# Patient Record
Sex: Male | Born: 1990 | Race: Black or African American | Hispanic: No | Marital: Single | Smoking: Former smoker
Health system: Southern US, Community
[De-identification: ages and names within clinical notes are randomized; demographics above are authoritative.]

## PROBLEM LIST (undated history)

## (undated) DIAGNOSIS — K219 Gastro-esophageal reflux disease without esophagitis: Secondary | ICD-10-CM

## (undated) DIAGNOSIS — G4700 Insomnia, unspecified: Secondary | ICD-10-CM

## (undated) DIAGNOSIS — S065XAA Traumatic subdural hemorrhage with loss of consciousness status unknown, initial encounter: Secondary | ICD-10-CM

## (undated) DIAGNOSIS — F209 Schizophrenia, unspecified: Secondary | ICD-10-CM

## (undated) HISTORY — DX: Gastro-esophageal reflux disease without esophagitis: K21.9

## (undated) HISTORY — DX: Schizophrenia, unspecified: F20.9

## (undated) HISTORY — PX: HAND SURGERY: SHX662

---
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
## 2009-01-29 ENCOUNTER — Emergency Department (HOSPITAL_COMMUNITY): Admission: EM | Admit: 2009-01-29 | Discharge: 2009-01-29 | Payer: Self-pay | Admitting: Emergency Medicine

## 2009-02-05 ENCOUNTER — Emergency Department (HOSPITAL_COMMUNITY): Admission: EM | Admit: 2009-02-05 | Discharge: 2009-02-05 | Payer: Self-pay | Admitting: Emergency Medicine

## 2010-04-24 ENCOUNTER — Emergency Department (HOSPITAL_COMMUNITY)
Admission: EM | Admit: 2010-04-24 | Discharge: 2010-04-24 | Payer: Self-pay | Source: Home / Self Care | Admitting: Emergency Medicine

## 2010-04-24 ENCOUNTER — Emergency Department (HOSPITAL_COMMUNITY)
Admission: EM | Admit: 2010-04-24 | Discharge: 2010-04-24 | Disposition: A | Discharge status: Other Acute Inpatient Hospital | Payer: Self-pay | Source: Home / Self Care | Admitting: Family Medicine

## 2010-07-04 LAB — RAPID STREP SCREEN (MED CTR MEBANE ONLY): Streptococcus, Group A Screen (Direct): NEGATIVE

## 2011-01-11 ENCOUNTER — Inpatient Hospital Stay (INDEPENDENT_AMBULATORY_CARE_PROVIDER_SITE_OTHER)
Admission: RE | Admit: 2011-01-11 | Discharge: 2011-01-11 | Disposition: A | Discharge status: Home / Self Care | Payer: Federal, State, Local not specified - PPO | Source: Ambulatory Visit | Attending: Emergency Medicine | Admitting: Emergency Medicine

## 2011-01-11 DIAGNOSIS — J029 Acute pharyngitis, unspecified: Secondary | ICD-10-CM

## 2011-02-10 ENCOUNTER — Ambulatory Visit (INDEPENDENT_AMBULATORY_CARE_PROVIDER_SITE_OTHER): Payer: Federal, State, Local not specified - PPO

## 2011-02-10 ENCOUNTER — Inpatient Hospital Stay (INDEPENDENT_AMBULATORY_CARE_PROVIDER_SITE_OTHER)
Admission: RE | Admit: 2011-02-10 | Discharge: 2011-02-10 | Disposition: A | Discharge status: Home / Self Care | Payer: Federal, State, Local not specified - PPO | Source: Ambulatory Visit | Attending: Emergency Medicine | Admitting: Emergency Medicine

## 2011-02-10 DIAGNOSIS — J019 Acute sinusitis, unspecified: Secondary | ICD-10-CM

## 2011-02-10 DIAGNOSIS — S62309A Unspecified fracture of unspecified metacarpal bone, initial encounter for closed fracture: Secondary | ICD-10-CM

## 2011-02-10 DIAGNOSIS — I10 Essential (primary) hypertension: Secondary | ICD-10-CM

## 2011-12-03 ENCOUNTER — Encounter (HOSPITAL_COMMUNITY): Payer: Self-pay

## 2011-12-03 ENCOUNTER — Emergency Department (INDEPENDENT_AMBULATORY_CARE_PROVIDER_SITE_OTHER)
Admission: EM | Admit: 2011-12-03 | Discharge: 2011-12-03 | Disposition: A | Discharge status: Home / Self Care | Payer: Self-pay | Source: Home / Self Care

## 2011-12-03 DIAGNOSIS — Z2089 Contact with and (suspected) exposure to other communicable diseases: Secondary | ICD-10-CM

## 2011-12-03 DIAGNOSIS — R300 Dysuria: Secondary | ICD-10-CM

## 2011-12-03 DIAGNOSIS — Z202 Contact with and (suspected) exposure to infections with a predominantly sexual mode of transmission: Secondary | ICD-10-CM

## 2011-12-03 LAB — POCT URINALYSIS DIP (DEVICE)
Glucose, UA: NEGATIVE mg/dL
Ketones, ur: NEGATIVE mg/dL
Leukocytes, UA: NEGATIVE

## 2011-12-03 MED ORDER — AZITHROMYCIN 250 MG PO TABS
1000.0000 mg | ORAL_TABLET | Freq: Every day | ORAL | Status: DC
  Administered 2011-12-03: 1000 mg via ORAL

## 2011-12-03 MED ORDER — METRONIDAZOLE 500 MG PO TABS: 2000.0000 mg | ORAL_TABLET | Freq: Once | ORAL | Status: AC

## 2011-12-03 MED ORDER — AZITHROMYCIN 250 MG PO TABS: 1000.0000 mg | ORAL_TABLET | Freq: Once | ORAL | Status: DC

## 2011-12-03 MED ORDER — LIDOCAINE HCL (PF) 1 % IJ SOLN
INTRAMUSCULAR | Status: AC
  Filled 2011-12-03: qty 5

## 2011-12-03 MED ORDER — CEFTRIAXONE SODIUM 250 MG IJ SOLR
INTRAMUSCULAR | Status: AC
  Filled 2011-12-03: qty 250

## 2011-12-03 MED ORDER — AZITHROMYCIN 250 MG PO TABS
ORAL_TABLET | ORAL | Status: AC
  Filled 2011-12-03: qty 4

## 2011-12-03 MED ORDER — METRONIDAZOLE 500 MG PO TABS: 2000.0000 mg | ORAL_TABLET | Freq: Once | ORAL | Status: DC

## 2011-12-03 MED ORDER — CEFTRIAXONE SODIUM 250 MG IJ SOLR
250.0000 mg | Freq: Once | INTRAMUSCULAR | Status: AC
  Administered 2011-12-03: 250 mg via INTRAMUSCULAR

## 2011-12-03 NOTE — ED Notes (Signed)
Pt has burning when he urinates for two weeks and states his girlfriend told him she has unknown std.

## 2011-12-03 NOTE — ED Provider Notes (Signed)
History     CSN: [id number]  Arrival date & time 12/03/11  1736   None     Chief Complaint  Patient presents with  . Exposure to STD    (Consider location/radiation/quality/duration/timing/severity/associated sxs/prior treatment) Patient is a 21 y.o. male presenting with STD exposure. The history is provided by the patient.  Exposure to STD This is a new problem. The current episode started more than 1 week ago. The problem occurs daily. The problem has not changed since onset. patient reports his girlfriend told him today that she was tested positive for STD, admits to burning with urination for the past two weeks, denies penile discharge.  No known history of STD.  Does not use condoms, denies change in sexual partner.    History reviewed. No pertinent past medical history.  History reviewed. No pertinent past surgical history.  History reviewed. No pertinent family history.  History  Substance Use Topics  . Smoking status: Never Smoker   . Smokeless tobacco: Not on file  . Alcohol Use: Yes      Review of Systems  Constitutional: Negative.   Respiratory: Negative.   Cardiovascular: Negative.   Gastrointestinal: Negative.   Genitourinary: Positive for dysuria. Negative for urgency, frequency, hematuria, flank pain, decreased urine volume, discharge, penile swelling, scrotal swelling, difficulty urinating, genital sores, penile pain and testicular pain.    Allergies  Review of patient's allergies indicates no known allergies.  Home Medications  No current outpatient prescriptions on file.  BP 146/54  Pulse 60  Temp 98.5 F (36.9 C) (Oral)  Resp 18  SpO2 100%  Physical Exam  Nursing note and vitals reviewed. Constitutional: He is oriented to person, place, and time. Vital signs are normal. He appears well-developed and well-nourished. He is active and cooperative.  HENT:  Head: Normocephalic.  Eyes: Conjunctivae are normal. Pupils are equal, round, and  reactive to light. No scleral icterus.  Neck: Trachea normal. Neck supple.  Cardiovascular: Normal rate, regular rhythm, normal heart sounds and normal pulses.   Pulmonary/Chest: Effort normal and breath sounds normal.  Abdominal: Soft. Normal appearance and bowel sounds are normal. There is no tenderness. There is no rebound and no CVA tenderness.  Genitourinary: Testes normal and penis normal. Cremasteric reflex is present. Right testis shows no swelling and no tenderness. Right testis is descended. Left testis shows no swelling and no tenderness. Left testis is descended. Circumcised. No phimosis, hypospadias, penile erythema or penile tenderness. No discharge found.  Lymphadenopathy:    He has no cervical adenopathy.    He has no axillary adenopathy.       Right: No inguinal adenopathy present.       Left: No inguinal adenopathy present.  Neurological: He is alert and oriented to person, place, and time. No cranial nerve deficit or sensory deficit. GCS eye subscore is 4. GCS verbal subscore is 5. GCS motor subscore is 6.  Skin: Skin is warm and dry. No rash noted.  Psychiatric: He has a normal mood and affect. His speech is normal and behavior is normal. Judgment and thought content normal. Cognition and memory are normal.    ED Course  Procedures (including critical care time)  Labs Reviewed  POCT URINALYSIS DIP (DEVICE) - Abnormal; Notable for the following:    Bilirubin Urine SMALL (*)     Protein, ur 30 (*)     All other components within normal limits  GC/CHLAMYDIA PROBE AMP, GENITAL   No results found.   1. Exposure to  STD   2. Dysuria       MDM  Rocephin 250mg  and Azithromycin 1gm administered in office.  Take flagyl as prescribed.  Await GC/CT culture.  Abstain from intercourse for 7 days.  Condoms for STD prevention.  Recheck for STD's in 3 months.        Johnsie Kindred, NP 12/03/11 1944  Johnsie Kindred, NP 12/08/11 2231

## 2011-12-04 LAB — GC/CHLAMYDIA PROBE AMP, GENITAL
Chlamydia, DNA Probe: NEGATIVE
GC Probe Amp, Genital: NEGATIVE

## 2011-12-20 NOTE — ED Provider Notes (Signed)
Medical screening examination/treatment/procedure(s) were performed by non-physician practitioner and as supervising physician I was immediately available for consultation/collaboration.  Luiz Blare MD   Luiz Blare, MD 12/20/11 2109

## 2015-01-04 ENCOUNTER — Emergency Department (HOSPITAL_COMMUNITY)
Admission: EM | Admit: 2015-01-04 | Discharge: 2015-01-04 | Disposition: A | Discharge status: Home / Self Care | Payer: Federal, State, Local not specified - PPO | Attending: Physician Assistant | Admitting: Physician Assistant

## 2015-01-04 ENCOUNTER — Encounter (HOSPITAL_COMMUNITY): Payer: Self-pay | Admitting: Emergency Medicine

## 2015-01-04 DIAGNOSIS — J039 Acute tonsillitis, unspecified: Secondary | ICD-10-CM | POA: Diagnosis not present

## 2015-01-04 DIAGNOSIS — J029 Acute pharyngitis, unspecified: Secondary | ICD-10-CM | POA: Diagnosis not present

## 2015-01-04 DIAGNOSIS — R509 Fever, unspecified: Secondary | ICD-10-CM

## 2015-01-04 DIAGNOSIS — Z720 Tobacco use: Secondary | ICD-10-CM | POA: Diagnosis not present

## 2015-01-04 DIAGNOSIS — R52 Pain, unspecified: Secondary | ICD-10-CM

## 2015-01-04 DIAGNOSIS — R0981 Nasal congestion: Secondary | ICD-10-CM

## 2015-01-04 LAB — RAPID STREP SCREEN (MED CTR MEBANE ONLY): Streptococcus, Group A Screen (Direct): NEGATIVE

## 2015-01-04 MED ORDER — PHENOL 1.4 % MT LIQD
1.0000 | OROMUCOSAL | Status: DC | PRN
  Administered 2015-01-04: 1 via OROMUCOSAL
  Filled 2015-01-04 (×2): qty 177

## 2015-01-04 MED ORDER — ACETAMINOPHEN 325 MG PO TABS
650.0000 mg | ORAL_TABLET | Freq: Once | ORAL | Status: AC
  Administered 2015-01-04: 650 mg via ORAL
  Filled 2015-01-04: qty 2

## 2015-01-04 NOTE — ED Notes (Signed)
Called pharmacy to check on chloraseptic

## 2015-01-04 NOTE — Discharge Instructions (Signed)
Continue to stay well-hydrated. Gargle warm salt water and spit it out. Use chloraseptic spray as needed for sore throat. Continue to alternate between Tylenol and Ibuprofen for pain or fever. Use Mucinex for cough suppression/expectoration of mucus. Use netipot and flonase to help with nasal congestion. May consider over-the-counter Benadryl or other antihistamine to decrease secretions and for watery itchy eyes. Followup with your primary care doctor in 5-7 days for recheck of ongoing symptoms. Return to emergency department for emergent changing or worsening of symptoms.   Pharyngitis Pharyngitis is a sore throat (pharynx). There is redness, pain, and swelling of your throat. HOME CARE   Drink enough fluids to keep your pee (urine) clear or pale yellow.  Only take medicine as told by your doctor.  You may get sick again if you do not take medicine as told. Finish your medicines, even if you start to feel better.  Do not take aspirin.  Rest.  Rinse your mouth (gargle) with salt water ( tsp of salt per 1 qt of water) every 1-2 hours. This will help the pain.  If you are not at risk for choking, you can suck on hard candy or sore throat lozenges. GET HELP IF:  You have large, tender lumps on your neck.  You have a rash.  You cough up green, yellow-brown, or bloody spit. GET HELP RIGHT AWAY IF:   You have a stiff neck.  You drool or cannot swallow liquids.  You throw up (vomit) or are not able to keep medicine or liquids down.  You have very bad pain that does not go away with medicine.  You have problems breathing (not from a stuffy nose). MAKE SURE YOU:   Understand these instructions.  Will watch your condition.  Will get help right away if you are not doing well or get worse. Document Released: 09/27/2007 Document Revised: 01/29/2013 Document Reviewed: 12/16/2012 [facility] Patient Information 2015 [place], [place]. This information is not intended to replace advice  given to you by your health care provider. Make sure you discuss any questions you have with your health care provider.  Salt Water Gargle This solution will help make your mouth and throat feel better. HOME CARE INSTRUCTIONS   Mix 1 teaspoon of salt in 8 ounces of warm water.  Gargle with this solution as much or often as you need or as directed. Swish and gargle gently if you have any sores or wounds in your mouth.  Do not swallow this mixture. Document Released: 01/13/2004 Document Revised: 07/03/2011 Document Reviewed: 06/05/2008 [facility] Patient Information 2015 [place], [place]. This information is not intended to replace advice given to you by your health care provider. Make sure you discuss any questions you have with your health care provider.  Upper Respiratory Infection, Adult An upper respiratory infection (URI) is also sometimes known as the common cold. The upper respiratory tract includes the nose, sinuses, throat, trachea, and bronchi. Bronchi are the airways leading to the lungs. Most people improve within 1 week, but symptoms can last up to 2 weeks. A residual cough may last even longer.  CAUSES Many different viruses can infect the tissues lining the upper respiratory tract. The tissues become irritated and inflamed and often become very moist. Mucus production is also common. A cold is contagious. You can easily spread the virus to others by oral contact. This includes kissing, sharing a glass, coughing, or sneezing. Touching your mouth or nose and then touching a surface, which is then touched by another  person, can also spread the virus. SYMPTOMS  Symptoms typically develop 1 to 3 days after you come in contact with a cold virus. Symptoms vary from person to person. They may include:  Runny nose.  Sneezing.  Nasal congestion.  Sinus irritation.  Sore throat.  Loss of voice (laryngitis).  Cough.  Fatigue.  Muscle aches.  Loss of  appetite.  Headache.  Low-grade fever. DIAGNOSIS  You might diagnose your own cold based on familiar symptoms, since most people get a cold 2 to 3 times a year. Your caregiver can confirm this based on your exam. Most importantly, your caregiver can check that your symptoms are not due to another disease such as strep throat, sinusitis, pneumonia, asthma, or epiglottitis. Blood tests, throat tests, and X-rays are not necessary to diagnose a common cold, but they may sometimes be helpful in excluding other more serious diseases. Your caregiver will decide if any further tests are required. RISKS AND COMPLICATIONS  You may be at risk for a more severe case of the common cold if you smoke cigarettes, have chronic heart disease (such as heart failure) or lung disease (such as asthma), or if you have a weakened immune system. The very young and very old are also at risk for more serious infections. Bacterial sinusitis, middle ear infections, and bacterial pneumonia can complicate the common cold. The common cold can worsen asthma and chronic obstructive pulmonary disease (COPD). Sometimes, these complications can require emergency medical care and may be life-threatening. PREVENTION  The best way to protect against getting a cold is to practice good hygiene. Avoid oral or hand contact with people with cold symptoms. Wash your hands often if contact occurs. There is no clear evidence that vitamin C, vitamin E, echinacea, or exercise reduces the chance of developing a cold. However, it is always recommended to get plenty of rest and practice good nutrition. TREATMENT  Treatment is directed at relieving symptoms. There is no cure. Antibiotics are not effective, because the infection is caused by a virus, not by bacteria. Treatment may include:  Increased fluid intake. Sports drinks offer valuable electrolytes, sugars, and fluids.  Breathing heated mist or steam (vaporizer or shower).  Eating chicken soup  or other clear broths, and maintaining good nutrition.  Getting plenty of rest.  Using gargles or lozenges for comfort.  Controlling fevers with ibuprofen or acetaminophen as directed by your caregiver.  Increasing usage of your inhaler if you have asthma. Zinc gel and zinc lozenges, taken in the first 24 hours of the common cold, can shorten the duration and lessen the severity of symptoms. Pain medicines may help with fever, muscle aches, and throat pain. A variety of non-prescription medicines are available to treat congestion and runny nose. Your caregiver can make recommendations and may suggest nasal or lung inhalers for other symptoms.  HOME CARE INSTRUCTIONS   Only take over-the-counter or prescription medicines for pain, discomfort, or fever as directed by your caregiver.  Use a warm mist humidifier or inhale steam from a shower to increase air moisture. This may keep secretions moist and make it easier to breathe.  Drink enough water and fluids to keep your urine clear or pale yellow.  Rest as needed.  Return to work when your temperature has returned to normal or as your caregiver advises. You may need to stay home longer to avoid infecting others. You can also use a face mask and careful hand washing to prevent spread of the virus. SEEK MEDICAL CARE  IF:   After the first few days, you feel you are getting worse rather than better.  You need your caregiver's advice about medicines to control symptoms.  You develop chills, worsening shortness of breath, or brown or red sputum. These may be signs of pneumonia.  You develop yellow or brown nasal discharge or pain in the face, especially when you bend forward. These may be signs of sinusitis.  You develop a fever, swollen neck glands, pain with swallowing, or white areas in the back of your throat. These may be signs of strep throat. SEEK IMMEDIATE MEDICAL CARE IF:   You have a fever.  You develop severe or persistent  headache, ear pain, sinus pain, or chest pain.  You develop wheezing, a prolonged cough, cough up blood, or have a change in your usual mucus (if you have chronic lung disease).  You develop sore muscles or a stiff neck. Document Released: 10/04/2000 Document Revised: 07/03/2011 Document Reviewed: 07/16/2013 [facility] Patient Information 2015 [place], [place]. This information is not intended to replace advice given to you by your health care provider. Make sure you discuss any questions you have with your health care provider.

## 2015-01-04 NOTE — ED Notes (Signed)
Patient states generalized body aches x 2 days.  Patient also complain of sore throat.  Patient states did take theraflu and bc at home without relief.   Patient denies N/V/D.

## 2015-01-04 NOTE — ED Provider Notes (Signed)
CSN: [id number]     Arrival date & time 01/04/15  4098 History  This chart was scribed for non-physician practitioner Allen Derry, PA-C working with Abelino Derrick, MD by Lyndel Safe, ED Scribe. This patient was seen in room TR06C/TR06C and the patient's care was started at 11:05 AM.   Chief Complaint  Patient presents with  . flu like symptoms    Patient is a 24 y.o. male presenting with URI. The history is provided by the patient. No language interpreter was used.  URI Presenting symptoms: cough (dry), fever, rhinorrhea and sore throat   Presenting symptoms: no ear pain   Severity:  Moderate Onset quality:  Sudden Duration:  1 day Timing:  Constant Progression:  Worsening Chronicity:  New Relieved by:  OTC medications (Theraflu, BC powder) Worsened by:  Drinking and eating Ineffective treatments:  None tried Associated symptoms: headaches (mild) and myalgias   Associated symptoms: no neck pain and no wheezing   Risk factors: no immunosuppression, no recent travel and no sick contacts    HPI Comments: James [name] is a 24 y.o. male, with no chronic medical conditions, who presents to the Emergency Department complaining of sudden onset, constant, 8/10, scratching, non-radiating sore throat onset 2 days ago that is exacerbated with eating or drinking, relieved with Theraflu and BC powders. Associated symptoms include mild headache, generalized body aches, mild intermittent dry cough, clear rhinorrhea, low grade fever of 100.9 F, and chills. Pt denies otalgia, ear drainage, trismus, drooling, wheezing, nausea, vomiting, diarrhea, abdominal pain, CP, SOB, arthralgias, hematuria, dysuria, numbness, tingling or weakness, neck stiffness, penile discharge. No recent travel or sick contacts.   History reviewed. No pertinent past medical history. History reviewed. No pertinent past surgical history. No family history on file. Social History  Substance Use Topics  .  Smoking status: Current Every Day Smoker -- 0.50 packs/day    Types: Cigarettes  . Smokeless tobacco: None  . Alcohol Use: Yes     Comment: every day    Review of Systems  Constitutional: Positive for fever and chills.  HENT: Positive for rhinorrhea and sore throat. Negative for drooling, ear discharge, ear pain and trouble swallowing.   Eyes: Negative for discharge, itching and visual disturbance.  Respiratory: Positive for cough (dry). Negative for shortness of breath and wheezing.   Cardiovascular: Negative for chest pain.  Gastrointestinal: Negative for nausea, vomiting, abdominal pain and diarrhea.  Genitourinary: Negative for dysuria, hematuria and discharge.  Musculoskeletal: Positive for myalgias. Negative for neck pain and neck stiffness.  Skin: Negative for rash.  Allergic/Immunologic: Negative for immunocompromised state.  Neurological: Positive for headaches (mild). Negative for weakness and numbness.  A complete 10 system review of systems was obtained and is otherwise negative except at noted in the HPI and PMH.  Allergies  Review of patient's allergies indicates no known allergies.  Home Medications   Prior to Admission medications   Not on File   BP 130/71 mmHg  Pulse 90  Temp(Src) 100.9 F (38.3 C) (Oral)  Resp 18  Ht 5\' 10"  (1.778 m)  Wt 170 lb (77.111 kg)  BMI 24.39 kg/m2  SpO2 96% Physical Exam  Constitutional: He is oriented to person, place, and time. He appears well-developed and well-nourished.  Non-toxic appearance. No distress.  Febrile at 100.50F, nontoxic, NAD  HENT:  Head: Normocephalic and atraumatic.  Right Ear: Hearing, tympanic membrane, external ear and ear canal normal.  Left Ear: Hearing, tympanic membrane, external ear and ear canal normal.  Nose: Mucosal edema and rhinorrhea present.  Mouth/Throat: Mucous membranes are normal. No trismus in the jaw. No uvula swelling. Oropharyngeal exudate, posterior oropharyngeal edema and posterior  oropharyngeal erythema present. No tonsillar abscesses.  Ears are clear bilaterally. Nose with mild mucosal edema and clear rhinorrhea. Oropharynx without uvular swelling or deviation, no trismus or drooling, with 2+ B/L tonsillar swelling and erythema, +exudates, no PTA.    Eyes: Conjunctivae and EOM are normal. Right eye exhibits no discharge. Left eye exhibits no discharge.  Neck: Normal range of motion. Neck supple.  No meningismus.   Cardiovascular: Normal rate, regular rhythm, normal heart sounds and intact distal pulses.  Exam reveals no gallop and no friction rub.   No murmur heard. Pulmonary/Chest: Effort normal and breath sounds normal. No respiratory distress. He has no decreased breath sounds. He has no wheezes. He has no rhonchi. He has no rales.  Abdominal: Normal appearance. He exhibits no distension. There is no tenderness. There is no rigidity, no rebound and no guarding.  Musculoskeletal: Normal range of motion.  Lymphadenopathy:       Head (right side): Tonsillar adenopathy present.       Head (left side): Tonsillar adenopathy present.  Bilateral tonsillar LAD with mild TTP.   Neurological: He is alert and oriented to person, place, and time. He has normal strength. No sensory deficit.  Skin: Skin is warm, dry and intact. No rash noted.  Psychiatric: He has a normal mood and affect.  Nursing note and vitals reviewed.   ED Course  Procedures  DIAGNOSTIC STUDIES: Oxygen Saturation is 96% on RA, adequate by my interpretation.    COORDINATION OF CARE: 11:08 AM Discussed treatment plan with pt. Pt acknowledges and agrees to plan.   Labs Review Labs Reviewed  RAPID STREP SCREEN (NOT AT [facility])  CULTURE, GROUP A STREP    Imaging Review No results found. I have personally reviewed and evaluated these images and lab results as part of my medical decision-making.   EKG Interpretation None      MDM   Final diagnoses:  Pharyngitis  Tonsillitis  Body aches  Nasal  congestion  Fever, unspecified fever cause    24 y.o. male here with URI symptoms, body aches, sore throat, fever. Clear lung exam, tonsils with exudates and swelling, will obtain RST. Will give tylenol and chloraseptic spray and reassess shortly.   12:34 PM RST neg. Discussed symptomatic control. Recheck VS reveals improvement of fever. Discussed tylenol/motrin for pain/fever. Salt water gargles. Too early to test for mono but discussed that this could be a possible alternative diagnosis. Close PCP f/up. I explained the diagnosis and have given explicit precautions to return to the ER including for any other new or worsening symptoms. The patient understands and accepts the medical plan as it's been dictated and I have answered their questions. Discharge instructions concerning home care and prescriptions have been given. The patient is STABLE and is discharged to home in good condition.   I personally performed the services described in this documentation, which was scribed in my presence. The recorded information has been reviewed and is accurate.  BP 120/74 mmHg  Pulse 95  Temp(Src) 99.4 F (37.4 C) (Oral)  Resp 17  Ht  (1.778 m)  Wt 170 lb (77.111 kg)  BMI 24.39 kg/m2  SpO2 98%  Meds ordered this encounter  Medications  . acetaminophen (TYLENOL) tablet 650 mg    Sig:   . phenol (CHLORASEPTIC) mouth spray 1 spray  SigAllen Derry, PA-C 01/04/15 1241  Courteney Randall An, MD 01/04/15 1536

## 2015-01-06 LAB — CULTURE, GROUP A STREP

## 2016-10-29 ENCOUNTER — Emergency Department (HOSPITAL_COMMUNITY): Payer: Federal, State, Local not specified - PPO

## 2016-10-29 ENCOUNTER — Encounter (HOSPITAL_COMMUNITY): Payer: Self-pay

## 2016-10-29 ENCOUNTER — Emergency Department (HOSPITAL_COMMUNITY)
Admission: EM | Admit: 2016-10-29 | Discharge: 2016-10-29 | Disposition: A | Discharge status: Home / Self Care | Payer: Federal, State, Local not specified - PPO | Attending: Physician Assistant | Admitting: Physician Assistant

## 2016-10-29 DIAGNOSIS — F17210 Nicotine dependence, cigarettes, uncomplicated: Secondary | ICD-10-CM | POA: Insufficient documentation

## 2016-10-29 DIAGNOSIS — R509 Fever, unspecified: Secondary | ICD-10-CM

## 2016-10-29 DIAGNOSIS — A770 Spotted fever due to Rickettsia rickettsii: Secondary | ICD-10-CM | POA: Insufficient documentation

## 2016-10-29 LAB — RAPID HIV SCREEN (HIV 1/2 AB+AG)
HIV 1/2 Antibodies: NONREACTIVE
HIV-1 P24 ANTIGEN - HIV24: NONREACTIVE

## 2016-10-29 MED ORDER — IBUPROFEN 800 MG PO TABS
800.0000 mg | ORAL_TABLET | Freq: Once | ORAL | Status: AC
  Administered 2016-10-29: 800 mg via ORAL
  Filled 2016-10-29: qty 1

## 2016-10-29 MED ORDER — DOXYCYCLINE HYCLATE 100 MG PO CAPS: 100.0000 mg | ORAL_CAPSULE | Freq: Two times a day (BID) | ORAL | 0 refills | Status: AC

## 2016-10-29 MED ORDER — DOXYCYCLINE HYCLATE 100 MG PO TABS
100.0000 mg | ORAL_TABLET | Freq: Once | ORAL | Status: AC
  Administered 2016-10-29: 100 mg via ORAL
  Filled 2016-10-29: qty 1

## 2016-10-29 NOTE — Discharge Instructions (Signed)
We are unsure what is causing her fever. We are giving U and antibiotics in case that it is due to a tick born illness. Please follow-up with a primary care physician if not improving. Please use ibuprofen or Tylenol to help control your fevers at home.  To find a primary care or specialty doctor please call [phone] or [phone] to access "[facility] Find a Doctor Service."  You may also go on the [facility] website at [email or web address]  There are also multiple [facility] practices throughout the [place] that are frequently accepting new patients. You may find a clinic that is close to your home and contact them.  [name] -  [address] [place] [phone]  [facility] in [place] (also locations in [place] and [place]) -  [address] [place] [address] [phone]  [facility] -  [address] [place] [address] [phone]

## 2016-10-29 NOTE — ED Provider Notes (Signed)
[facility] Provider Note   CSN: [id number] Arrival date & time: 10/29/16  1513  By signing my name below, I, Thelma Barge, attest that this documentation has been prepared under the direction and in the presence of Mackuen, Courteney Lyn, *. Electronically Signed: Thelma Barge, Scribe. 10/29/16. 4:01 PM.  History   Chief Complaint Chief Complaint  Patient presents with  . cough/congestion   The history is provided by the patient. No language interpreter was used.    HPI Comments: James [name] is a 26 y.o. male who presents to the Emergency Department complaining of constant, gradually worsening headache for 2 days. He has associated generalized body aches, diaphoresis, and subjective fever. He reports abdominal pain but this has improved. He has been taking tylenol, advil, and ibuprofen with mild to no relief. His girlfriend notes he was sweating profusely last night and tried to place a fan on him with a cold compress with no relief. Last week he reports his stuff in his storage unit was moldy when he moved to a new house. He denies nausea, vomiting, sore throat, cough, and rashes. He further denies suspected tick bites and any outside yard work.   History reviewed. No pertinent past medical history.  There are no active problems to display for this patient.   History reviewed. No pertinent surgical history.     Home Medications    Prior to Admission medications   Not on File    Family History No family history on file.  Social History Social History  Substance Use Topics  . Smoking status: Current Every Day Smoker    Packs/day: 0.50    Types: Cigarettes  . Smokeless tobacco: Never Used  . Alcohol use Yes     Comment: every day     Allergies   Patient has no known allergies.   Review of Systems Review of Systems  Constitutional: Positive for diaphoresis and fever.  HENT: Negative for sore throat.   Gastrointestinal: Negative for nausea and  vomiting.  Musculoskeletal: Positive for myalgias.  Neurological: Positive for headaches.  All other systems reviewed and are negative.    Physical Exam Updated Vital Signs BP (!) 154/82   Pulse 80   Temp 99.2 F (37.3 C) (Oral)   Resp 18   SpO2 99%   Physical Exam  Constitutional: He is oriented to person, place, and time. He appears well-developed and well-nourished.  HENT:  Head: Normocephalic.  No erythema in throat  Eyes: EOM are normal.  Neck: Normal range of motion.  Pulmonary/Chest: Effort normal.  Abdominal: He exhibits no distension.  Musculoskeletal: Normal range of motion.  Neurological: He is alert and oriented to person, place, and time.  Skin: No rash noted.  Psychiatric: He has a normal mood and affect.  Nursing note and vitals reviewed.    ED Treatments / Results  DIAGNOSTIC STUDIES: Oxygen Saturation is 99% on RA, normal by my interpretation.    COORDINATION OF CARE: 3:52 PM Discussed treatment plan with pt at bedside and pt agreed to plan.  Labs (all labs ordered are listed, but only abnormal results are displayed) Labs Reviewed - No data to display  EKG  EKG Interpretation None       Radiology No results found.  Procedures Procedures (including critical care time)  Medications Ordered in ED Medications - No data to display   Initial Impression / Assessment and Plan / ED Course  I have reviewed the triage vital signs and the nursing notes.  Pertinent labs & imaging results that were available during my care of the patient were reviewed by me and considered in my medical decision making (see chart for details).     I personally performed the services described in this documentation, which was scribed in my presence. The recorded information has been reviewed and is accurate.   Patient is a 26 year old male presenting with fever. He reports she's been having on and off fevers for last 2-3 days. Patient has no other symptoms.  Patient reports mild cough. It is a summertime therefore atypical time to have a virus. The patient does not report that he does not spent a lot of time outdoors but I will cover him with doxycycline for tick borne illness. We'll send off RPR, HIV, RMSF.  We'll treat mild fever with ibuprofen. Patient otherwise eating and drinking normally. We'll have him follow-up with a primary care physician. Encouraged him to return with any focal symptoms such as rash, vomiting, urinary symptoms.  Final Clinical Impressions(s) / ED Diagnoses   Final diagnoses:  None    New Prescriptions New Prescriptions   No medications on file     Abelino DerrickMackuen, Courteney Lyn, MD 10/29/16 1715

## 2016-10-29 NOTE — ED Triage Notes (Signed)
Patient complains of 2 days of cough and congestion, reports fever and chills with same. Smoker. States that the symptoms started when he moved his furniture out of storage

## 2016-10-30 LAB — RPR: RPR: NONREACTIVE

## 2016-10-31 LAB — ROCKY MTN SPOTTED FVR ABS PNL(IGG+IGM)
RMSF IGG: POSITIVE — AB
RMSF IgM: 0.74 index (ref 0.00–0.89)

## 2016-10-31 LAB — RMSF, IGG, IFA

## 2017-01-24 ENCOUNTER — Encounter (HOSPITAL_COMMUNITY): Payer: Self-pay

## 2017-01-24 ENCOUNTER — Ambulatory Visit (HOSPITAL_COMMUNITY)
Admission: EM | Admit: 2017-01-24 | Discharge: 2017-01-24 | Disposition: A | Discharge status: Home / Self Care | Payer: BLUE CROSS/BLUE SHIELD | Attending: Family Medicine | Admitting: Family Medicine

## 2017-01-24 ENCOUNTER — Encounter (HOSPITAL_COMMUNITY): Payer: Self-pay | Admitting: Emergency Medicine

## 2017-01-24 ENCOUNTER — Emergency Department (HOSPITAL_COMMUNITY)
Admission: EM | Admit: 2017-01-24 | Discharge: 2017-01-25 | Disposition: A | Discharge status: Home / Self Care | Payer: BLUE CROSS/BLUE SHIELD | Attending: Emergency Medicine | Admitting: Emergency Medicine

## 2017-01-24 DIAGNOSIS — R440 Auditory hallucinations: Secondary | ICD-10-CM | POA: Diagnosis not present

## 2017-01-24 DIAGNOSIS — F1210 Cannabis abuse, uncomplicated: Secondary | ICD-10-CM | POA: Insufficient documentation

## 2017-01-24 DIAGNOSIS — F19951 Other psychoactive substance use, unspecified with psychoactive substance-induced psychotic disorder with hallucinations: Secondary | ICD-10-CM | POA: Diagnosis not present

## 2017-01-24 DIAGNOSIS — Z87891 Personal history of nicotine dependence: Secondary | ICD-10-CM | POA: Diagnosis not present

## 2017-01-24 DIAGNOSIS — R443 Hallucinations, unspecified: Secondary | ICD-10-CM | POA: Diagnosis not present

## 2017-01-24 DIAGNOSIS — F22 Delusional disorders: Secondary | ICD-10-CM | POA: Insufficient documentation

## 2017-01-24 DIAGNOSIS — R441 Visual hallucinations: Secondary | ICD-10-CM

## 2017-01-24 DIAGNOSIS — F1290 Cannabis use, unspecified, uncomplicated: Secondary | ICD-10-CM | POA: Diagnosis not present

## 2017-01-24 DIAGNOSIS — F19929 Other psychoactive substance use, unspecified with intoxication, unspecified: Secondary | ICD-10-CM | POA: Diagnosis present

## 2017-01-24 DIAGNOSIS — F1910 Other psychoactive substance abuse, uncomplicated: Secondary | ICD-10-CM

## 2017-01-24 LAB — COMPREHENSIVE METABOLIC PANEL
ALK PHOS: 49 U/L (ref 38–126)
ALT: 16 U/L — AB (ref 17–63)
AST: 23 U/L (ref 15–41)
Albumin: 4.8 g/dL (ref 3.5–5.0)
Anion gap: 11 (ref 5–15)
BUN: 16 mg/dL (ref 6–20)
CALCIUM: 10.2 mg/dL (ref 8.9–10.3)
CHLORIDE: 106 mmol/L (ref 101–111)
CO2: 24 mmol/L (ref 22–32)
Creatinine, Ser: 1.07 mg/dL (ref 0.61–1.24)
Glucose, Bld: 105 mg/dL — ABNORMAL HIGH (ref 65–99)
Potassium: 3.5 mmol/L (ref 3.5–5.1)
Sodium: 141 mmol/L (ref 135–145)
Total Bilirubin: 1.2 mg/dL (ref 0.3–1.2)
Total Protein: 8.1 g/dL (ref 6.5–8.1)

## 2017-01-24 LAB — CBC WITH DIFFERENTIAL/PLATELET
Basophils Absolute: 0 10*3/uL (ref 0.0–0.1)
Basophils Relative: 0 %
Eosinophils Absolute: 0 10*3/uL (ref 0.0–0.7)
Eosinophils Relative: 0 %
HEMATOCRIT: 44.5 % (ref 39.0–52.0)
HEMOGLOBIN: 15.4 g/dL (ref 13.0–17.0)
LYMPHS ABS: 0.9 10*3/uL (ref 0.7–4.0)
LYMPHS PCT: 14 %
MCH: 29.7 pg (ref 26.0–34.0)
MCHC: 34.6 g/dL (ref 30.0–36.0)
MCV: 85.9 fL (ref 78.0–100.0)
MONOS PCT: 7 %
Monocytes Absolute: 0.5 10*3/uL (ref 0.1–1.0)
NEUTROS ABS: 5.4 10*3/uL (ref 1.7–7.7)
NEUTROS PCT: 79 %
Platelets: 281 10*3/uL (ref 150–400)
RBC: 5.18 MIL/uL (ref 4.22–5.81)
RDW: 12.8 % (ref 11.5–15.5)
WBC: 6.8 10*3/uL (ref 4.0–10.5)

## 2017-01-24 LAB — RAPID URINE DRUG SCREEN, HOSP PERFORMED
Amphetamines: NOT DETECTED
Barbiturates: NOT DETECTED
Benzodiazepines: NOT DETECTED
Cocaine: NOT DETECTED
OPIATES: NOT DETECTED
Tetrahydrocannabinol: POSITIVE — AB

## 2017-01-24 LAB — SALICYLATE LEVEL

## 2017-01-24 LAB — ETHANOL

## 2017-01-24 LAB — ACETAMINOPHEN LEVEL

## 2017-01-24 NOTE — Discharge Instructions (Signed)
Go to [facility] now to see doctors that can treat your specific symptoms and help you with your problems.
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
## 2017-01-24 NOTE — ED Provider Notes (Signed)
[facility]    CSN: [id number] Arrival date & time: 01/24/17  1409     History   Chief Complaint Chief Complaint  Patient presents with  . Hallucinations    HPI James [name] is a 26 y.o. male.   26 year old male presents to the urgent care stating that he is feeling a little anxious feel like his head is full sometimes with a headache and feels like he cannot sit still. He is not sleeping well and admits to visual and all Tory hallucinations. He also describes some paranoid activity in which she thinks somebody is trying to get him. He had been hearing voices around him, not necessarily telling him to do anything. This morning he thought he saw someone walking across his yard and any looked quickly again and he was not there. Also admits to illicit drug use with weed and liquor. At this time patient denies desire to harm self or others. When asked if he had a way to do so he said no and no plan.he is driving, he drove here and he states he can drop to Center long.      History reviewed. No pertinent past medical history.  There are no active problems to display for this patient.   History reviewed. No pertinent surgical history.     Home Medications    Prior to Admission medications   Not on File    Family History No family history on file.  Social History Social History  Substance Use Topics  . Smoking status: Former Smoker    Packs/day: 0.50    Types: Cigarettes  . Smokeless tobacco: Never Used  . Alcohol use Yes     Comment: every day     Allergies   Patient has no known allergies.   Review of Systems Review of Systems  Constitutional: Negative.   Psychiatric/Behavioral: Positive for hallucinations and sleep disturbance. The patient is nervous/anxious.   All other systems reviewed and are negative.    Physical Exam Triage Vital Signs ED Triage Vitals  Enc Vitals Group     BP 01/24/17 1425 126/87     Pulse Rate 01/24/17 1425 89      Resp 01/24/17 1425 16     Temp 01/24/17 1425 98.7 F (37.1 C)     Temp Source 01/24/17 1425 Oral     SpO2 01/24/17 1425 98 %     Weight 01/24/17 1427 160 lb (72.6 kg)     Height 01/24/17 1427  (1.753 m)     Head Circumference --      Peak Flow --      Pain Score 01/24/17 1427 0     Pain Loc --      Pain Edu? --      Excl. in GC? --    No data found.   Updated Vital Signs BP 126/87   Pulse 89   Temp 98.7 F (37.1 C) (Oral)   Resp 16   Ht  (1.753 m)   Wt 160 lb (72.6 kg)   SpO2 98%   BMI 23.63 kg/m   Visual Acuity Right Eye Distance:   Left Eye Distance:   Bilateral Distance:    Right Eye Near:   Left Eye Near:    Bilateral Near:     Physical Exam  Constitutional: He is oriented to person, place, and time. He appears well-developed and well-nourished. No distress.  Neck: Neck supple.  Pulmonary/Chest: Effort normal.  Neurological: He  is alert and oriented to person, place, and time.  Skin: Skin is warm and dry.  Nursing note and vitals reviewed.    UC Treatments / Results  Labs (all labs ordered are listed, but only abnormal results are displayed) Labs Reviewed - No data to display  EKG  EKG Interpretation None       Radiology No results found.  Procedures Procedures (including critical care time)  Medications Ordered in UC Medications - No data to display   Initial Impression / Assessment and Plan / UC Course  I have reviewed the triage vital signs and the nursing notes.  Pertinent labs & imaging results that were available during my care of the patient were reviewed by me and considered in my medical decision making (see chart for details).    Go to [facility] now to see doctors that can treat your specific symptoms and help you with your problems.    Final Clinical Impressions(s) / UC Diagnoses   Final diagnoses:  Auditory hallucinations  Visual hallucinations  Drug abuse (HCC)  Paranoia (HCC)    New  Prescriptions New Prescriptions   No medications on file     Controlled Substance Prescriptions [place] Controlled Substance Registry consulted? Not Applicable   Hayden Rasmussen, NP 01/24/17 1541

## 2017-01-24 NOTE — BH Assessment (Signed)
[facility] Assessment Progress Note  James Sievert, PA recommends inpt treatment. EDP Cardama, Amadeo Garnet, MD notified of recommendation. Pt's nurse Marylou Flesher, RN also advised of disposition.   James [name], MSW, LCSW Therapeutic Triage Specialist  [phone]

## 2017-01-24 NOTE — ED Triage Notes (Signed)
Pt and family report that pt has been hearing and seeing things for the last week. Girlfriend reports that he has not been acting like himself and has been very paranoid. Pt denies pain, but states that he hs been itchy. Seen at urgent care earlier for same.

## 2017-01-24 NOTE — ED Triage Notes (Signed)
PT reports he is seeing and hearing things for the last week. PT reports he has hallucinated people walking by and hears general voices as though people are speaking. PT reports the voices do not give any specific commands, it's as if the TV is on.
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
## 2017-01-24 NOTE — ED Notes (Signed)
Bed: [facility]81 Expected date:  Expected time:  Means of arrival:  Comments: Hold for room 26

## 2017-01-25 DIAGNOSIS — Z87891 Personal history of nicotine dependence: Secondary | ICD-10-CM | POA: Diagnosis not present

## 2017-01-25 DIAGNOSIS — R443 Hallucinations, unspecified: Secondary | ICD-10-CM | POA: Diagnosis not present

## 2017-01-25 DIAGNOSIS — F19951 Other psychoactive substance use, unspecified with psychoactive substance-induced psychotic disorder with hallucinations: Secondary | ICD-10-CM | POA: Diagnosis not present

## 2017-01-25 DIAGNOSIS — F19929 Other psychoactive substance use, unspecified with intoxication, unspecified: Secondary | ICD-10-CM | POA: Diagnosis not present

## 2017-01-25 NOTE — [facility] Suicide Risk Assessment (Signed)
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 He appears well-developed and well-nourished.  HENT:  Head: Normocephalic.  Neck: Normal range of motion.  Respiratory: Effort normal.  Musculoskeletal: Normal range of motion.  Neurological: He is alert and oriented to person, place, and time.  Psychiatric: He has a normal mood and affect. His speech is normal and behavior is normal. Judgment and thought content normal. Cognition and memory are normal.    Review of Systems  Psychiatric/Behavioral: Positive for substance abuse.  All other systems reviewed and are negative.   Blood pressure 138/78, pulse 65, temperature 98.8 F (37.1 C), temperature source Oral, resp. rate 18, SpO2 100 %.There is no height or weight on file to calculate BMI.  General Appearance: Casual  Eye Contact:  Good  Speech:  Normal Rate  Volume:  Normal  Mood:  Euthymic  Affect:  Congruent  Thought Process:  Coherent and Descriptions of Associations: Intact  Orientation:  Full (Time, Place, and Person)  Thought Content:  WDL and Logical  Suicidal Thoughts:  No  Homicidal Thoughts:  No  Memory:  Immediate;   Good Recent;   Good Remote;   Good  Judgement:  Fair  Insight:  Fair  Psychomotor Activity:  Normal  Concentration:  Concentration: Good and Attention Span: Good  Recall:  Good   Fund of Knowledge:  Fair  Language:  Good  Akathisia:  No  Handed:  Right  AIMS (if indicated):     Assets:  Leisure Time Physical Health Resilience Social Support  ADL's:  Intact  Cognition:  WNL  Sleep:       Mental Status Per Nursing Assessment::   On Admission:   drug abuse with hallucinations  Demographic Factors:  Male and Adolescent or young adult  Loss Factors: Legal issues  Historical Factors: NA  Risk Reduction Factors:   Sense of responsibility to family, Living with another person, especially a relative and Positive social support  Continued Clinical Symptoms:  None  Cognitive Features That Contribute To Risk:  None    Suicide Risk:  Minimal: No identifiable suicidal ideation.  Patients presenting with no risk factors but with morbid ruminations; may be classified as minimal risk based on the severity of the depressive symptoms    Plan Of Care/Follow-up recommendations:  Activity:  as tolerated Diet:  heart healthy diet  Mckenna Boruff, NP 01/25/2017, 10:07 AM

## 2017-01-25 NOTE — BH Assessment (Signed)
[facility] Assessment Progress Note  Per Mojeed Akintayo, MD, this pt does not require psychiatric hospitalization at this time.  Pt is to be discharged from [facility] with referral information for area substance abuse treatment providers.  This has been included in pt's discharge instructions.  Pt would also benefit from seeing a Peer Support Specialist; they will be asked to speak with pt.  Pt's nurse, Ashley, has been notified.  James Donalson, MA Triage Specialist [phone]     

## 2017-01-25 NOTE — ED Notes (Addendum)
Patient denies S/HI/AH at this time. Patient reports VH. Plan of care discussed, Encouragement and support provided and safety maintain. Q 15 min safety checks in place and video monitoring.

## 2017-01-25 NOTE — ED Notes (Signed)
Peer support at bedside 

## 2017-01-25 NOTE — Consult Note (Signed)
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 male patient does not warrant admission.  HPI:  26 yo male who presented to the ED with hallucinations after using cannabis that he believes was laced.  Today, he is clear and coherent with no suicidal/homicidal ideations, hallucinations, or withdrawal symptoms.  No past psychiatric history besides substance abuse.  No past suicide attempts.  Caveat:  DWI court date in November.  Peer support referral, stable for discharge.  Past Psychiatric History: none   Risk to Self: None Risk to Others: None Prior Inpatient Therapy: Prior Inpatient Therapy: No Prior Outpatient Therapy: Prior Outpatient Therapy: No Does patient have an ACCT team?: No Does patient have Intensive In-House Services?  : No Does patient have [facility] services? : No Does patient have P4CC services?: No  Past Medical History: History reviewed. No pertinent past medical history. History reviewed. No pertinent surgical history. Family History: History reviewed. No pertinent family history. Family Psychiatric  History: unknown Social History:  History  Alcohol Use  . Yes    Comment: every day     History  Drug Use  . Types: Marijuana    Social History   Social History  . Marital status: Single    Spouse name: N/A  . Number of children: N/A  . Years of education: N/A   Social History Main Topics  . Smoking status: Former Smoker   Packs/day: 0.50    Types: Cigarettes  . Smokeless tobacco: Never Used  . Alcohol use Yes     Comment: every day  . Drug use: Yes    Types: Marijuana  . Sexual activity: Not Asked   Other Topics Concern  . None   Social History Narrative  . None   Additional Social History:    Allergies:  No Known Allergies  Labs:  Results for orders placed or performed during the hospital encounter of 01/24/17 (from the past 48 hour(s))  Comprehensive metabolic panel     Status: Abnormal   Collection Time: 01/24/17  4:43 PM  Result Value Ref Range   Sodium 141 135 - 145 mmol/L   Potassium 3.5 3.5 - 5.1 mmol/L   Chloride 106 101 - 111 mmol/L   CO2 24 22 - 32 mmol/L   Glucose, Bld 105 (H) 65 - 99 mg/dL   BUN 16 6 - 20 mg/dL   Creatinine, Ser 1.07 0.61 - 1.24 mg/dL   Calcium 10.2 8.9 - 10.3 mg/dL   Total Protein 8.1 6.5 - 8.1 g/dL   Albumin 4.8 3.5 - 5.0 g/dL   AST 23 15 - 41 U/L   ALT 16 (L) 17 - 63 U/L   Alkaline Phosphatase 49 38 - 126 U/L   Total Bilirubin 1.2 0.3 - 1.2 mg/dL   GFR calc non Af Amer >60 >60 mL/min   GFR calc Af Amer >60 >60 mL/min    Comment: (NOTE) The eGFR has been calculated using the CKD EPI equation. This calculation has not been validated in all clinical situations. eGFR's  persistently <60 mL/min signify possible Chronic Kidney Disease.    Anion gap 11 5 - 15  Ethanol     Status: None   Collection Time: 01/24/17  4:43 PM  Result Value Ref Range   Alcohol, Ethyl (B) <10 <10 mg/dL    Comment:        LOWEST DETECTABLE LIMIT FOR SERUM ALCOHOL IS 10 mg/dL FOR MEDICAL PURPOSES ONLY Please note change in reference range.   CBC with Diff     Status: None   Collection Time: 01/24/17  4:43 PM  Result Value Ref Range   WBC 6.8 4.0 - 10.5 K/uL   RBC 5.18 4.22 - 5.81 MIL/uL   Hemoglobin 15.4 13.0 - 17.0 g/dL   HCT 44.5 39.0 - 52.0 %   MCV 85.9 78.0 - 100.0 fL   MCH 29.7 26.0 - 34.0 pg   MCHC 34.6 30.0 - 36.0 g/dL   RDW 12.8 11.5 - 15.5 %   Platelets 281  150 - 400 K/uL   Neutrophils Relative % 79 %   Neutro Abs 5.4 1.7 - 7.7 K/uL   Lymphocytes Relative 14 %   Lymphs Abs 0.9 0.7 - 4.0 K/uL   Monocytes Relative 7 %   Monocytes Absolute 0.5 0.1 - 1.0 K/uL   Eosinophils Relative 0 %   Eosinophils Absolute 0.0 0.0 - 0.7 K/uL   Basophils Relative 0 %   Basophils Absolute 0.0 0.0 - 0.1 K/uL  Salicylate level     Status: None   Collection Time: 01/24/17  4:43 PM  Result Value Ref Range   Salicylate Lvl <9.5 2.8 - 30.0 mg/dL  Acetaminophen level     Status: Abnormal   Collection Time: 01/24/17  4:43 PM  Result Value Ref Range   Acetaminophen (Tylenol), Serum <10 (L) 10 - 30 ug/mL    Comment:        THERAPEUTIC CONCENTRATIONS VARY SIGNIFICANTLY. A RANGE OF 10-30 ug/mL MAY BE AN EFFECTIVE CONCENTRATION FOR MANY PATIENTS. HOWEVER, SOME ARE BEST TREATED AT CONCENTRATIONS OUTSIDE THIS RANGE. ACETAMINOPHEN CONCENTRATIONS >150 ug/mL AT 4 HOURS AFTER INGESTION AND >50 ug/mL AT 12 HOURS AFTER INGESTION ARE OFTEN ASSOCIATED WITH TOXIC REACTIONS.   Urine rapid drug screen (hosp performed)     Status: Abnormal   Collection Time: 01/24/17  5:07 PM  Result Value Ref Range   Opiates NONE DETECTED NONE DETECTED   Cocaine NONE DETECTED NONE DETECTED   Benzodiazepines NONE DETECTED NONE DETECTED   Amphetamines NONE DETECTED NONE DETECTED   Tetrahydrocannabinol POSITIVE (A) NONE DETECTED   Barbiturates NONE DETECTED NONE DETECTED    Comment:        DRUG SCREEN FOR MEDICAL PURPOSES ONLY.  IF CONFIRMATION IS NEEDED FOR ANY PURPOSE, NOTIFY LAB WITHIN 5 DAYS.        LOWEST DETECTABLE LIMITS FOR URINE DRUG SCREEN Drug Class       Cutoff (ng/mL) Amphetamine      1000 Barbiturate      200 Benzodiazepine   638 Tricyclics       756 Opiates          300 Cocaine          300 THC              50     No current facility-administered medications for this encounter.    Current Outpatient Prescriptions  Medication Sig Dispense Refill  .  ibuprofen (ADVIL,MOTRIN) 200 MG tablet Take 400-800 mg by mouth every 6 (six) hours as  needed for moderate pain.      Musculoskeletal: Strength & Muscle Tone: within normal limits Gait & Station: normal Patient leans: N/A  Psychiatric Specialty Exam: Physical Exam  Constitutional: He is oriented to person, place, and time. He appears well-developed and well-nourished.  HENT:  Head: Normocephalic.  Neck: Normal range of motion.  Respiratory: Effort normal.  Musculoskeletal: Normal range of motion.  Neurological: He is alert and oriented to person, place, and time.  Psychiatric: He has a normal mood and affect. His speech is normal and behavior is normal. Judgment and thought content normal. Cognition and memory are normal.    Review of Systems  Psychiatric/Behavioral: Positive for substance abuse.  All other systems reviewed and are negative.   Blood pressure 138/78, pulse 65, temperature 98.8 F (37.1 C), temperature source Oral, resp. rate 18, SpO2 100 %.There is no height or weight on file to calculate BMI.  General Appearance: Casual  Eye Contact:  Good  Speech:  Normal Rate  Volume:  Normal  Mood:  Euthymic  Affect:  Congruent  Thought Process:  Coherent and Descriptions of Associations: Intact  Orientation:  Full (Time, Place, and Person)  Thought Content:  WDL and Logical  Suicidal Thoughts:  No  Homicidal Thoughts:  No  Memory:  Immediate;   Good Recent;   Good Remote;   Good  Judgement:  Fair  Insight:  Fair  Psychomotor Activity:  Normal  Concentration:  Concentration: Good and Attention Span: Good  Recall:  Good  Fund of Knowledge:  Fair  Language:  Good  Akathisia:  No  Handed:  Right  AIMS (if indicated):     Assets:  Leisure Time Physical Health Resilience Social Support  ADL's:  Intact  Cognition:  WNL  Sleep:        Treatment Plan Summary: Daily contact with patient to assess and evaluate symptoms and progress in treatment, Medication  management and Plan substance induced psychosis with hallucinations:  -Crisis stabilization -Medication management:  None started as he needed to clear the drugs -Individual and substance abuse counseling -Peer support referral  Disposition: No evidence of imminent risk to self or others at present.    Waylan Boga, NP 01/25/2017 10:00 AM  Patient seen face-to-face for psychiatric evaluation, chart reviewed and case discussed with the physician extender and developed treatment plan. Reviewed the information documented and agree with the treatment plan. Corena Pilgrim, MD

## 2017-01-25 NOTE — Discharge Instructions (Signed)
To help you maintain a sober lifestyle, a substance abuse treatment program may be beneficial to you.  Contact one of the following providers at your earliest opportunity to ask about enrolling in their program: ° °     [facility] °     [address] °     [place], [place] [address] °     [phone] °     New patients are seen at the walk-in clinic every Tuesday from 9:00 am - 12:00 pm ° °     [facility] °     [address] °     [place], [place] [address] °     [phone] °

## 2017-01-25 NOTE — ED Notes (Signed)
Pt discharged home per MD order. Verbalizes understanding of AVS and follow up instructions. Denies HI/SI and A/V hallucinations at this time. All belongings returned to patient and belonging sheet signed. Signed E-signature. Patient ambulatory off unit with MHT to lobby.

## 2017-01-25 NOTE — Patient Outreach (Signed)
ED Peer Support Specialist Patient Intake (Complete at intake & 30-60 Day Follow-up)  Name: James [name]  MRN: [id number]  Age: 26 y.o.   Date of Admission: 01/25/2017  Intake: Initial Comments:      Primary Reason Admitted:Pt reports he began to experience these symptoms after using marijuana with a friend about 3 weeks ago. Pt reports he has been experiencing AVH without command. Pt states he thought he saw someone walking in his yard and when he looked again there was no one there. Pt reports he is paranoid and constantly thinks people are out to get him or talking about him. Pt states today he intentionally ingested 2 Advil because his head was hurting and about 2 hours later he took 6 ibuprofen. Pt was asked if he was trying to harm himself by taking that many pills and pt stated "I don't know. I just wanted the voices to stop." Pt stated "um, I just feel weird."    Lab values: Alcohol/ETOH: Positive Positive UDS? No Amphetamines: No Barbiturates: No Benzodiazepines: No Cocaine: No Opiates: No Cannabinoids: Yes  Demographic information: Gender: Male Ethnicity: African American Marital Status: Single Insurance Status: Other (comment) (Blue cross blue shield) Control and instrumentation engineer (Work Engineer, agricultural, Sales executive, etc.: Yes (Food stamps) Lives with: Partner/Spouse Living situation: House/Apartment  Reported Patient History: Patient reported health conditions: Depression Patient aware of HIV and hepatitis status: No  In past year, has patient visited ED for any reason? Yes  Number of ED visits: 1  Reason(s) for visit: Fever hot and cold   In past year, has patient been hospitalized for any reason? No  Number of hospitalizations:    Reason(s) for hospitalization:    In past year, has patient been arrested? No  Number of arrests:    Reason(s) for arrest:    In past year, has patient been incarcerated? No  Number of incarcerations:    Reason(s)  for incarceration:    In past year, has patient received medication-assisted treatment? No  In past year, patient received the following treatments: Other (comment) ([facility])  In past year, has patient received any harm reduction services? No  Did this include any of the following?    In past year, has patient received care from a mental health provider for diagnosis other than SUD? No  In past year, is this first time patient has overdosed? No (Advil)  Number of past overdoses: 0  In past year, is this first time patient has been hospitalized for an overdose? No  Number of hospitalizations for overdose(s):    Is patient currently receiving treatment for a mental health diagnosis? No  Patient reports experiencing difficulty participating in SUD treatment: No    Most important reason(s) for this difficulty?    Has patient received prior services for treatment? Yes ([facility])  In past, patient has received services from following agencies: [facility]  Plan of Care:  Suggested follow up at these agencies/treatment centers: [facility]  Other information: Processed with Pt and encouraged him to continue to attend the meetings also to contact [facility] Jonny Ruiz or Arlys John when discharged to continue to offer services.    Arlys John Deniyah Dillavou, [facility]  01/25/2017 11:23 AM

## 2017-03-07 ENCOUNTER — Telehealth (HOSPITAL_COMMUNITY): Payer: Self-pay

## 2017-03-07 NOTE — Telephone Encounter (Signed)
[facility] left message asking for Pt to contact the return number for [facility].

## 2017-09-16 ENCOUNTER — Emergency Department (HOSPITAL_COMMUNITY): Payer: BLUE CROSS/BLUE SHIELD

## 2017-09-16 ENCOUNTER — Inpatient Hospital Stay (HOSPITAL_COMMUNITY)
Admission: EM | Admit: 2017-09-16 | Discharge: 2017-09-18 | DRG: 083 | Disposition: A | Discharge status: Home / Self Care | Payer: BLUE CROSS/BLUE SHIELD | Attending: Neurosurgery | Admitting: Neurosurgery

## 2017-09-16 ENCOUNTER — Other Ambulatory Visit: Payer: Self-pay

## 2017-09-16 ENCOUNTER — Encounter (HOSPITAL_COMMUNITY): Payer: Self-pay | Admitting: *Deleted

## 2017-09-16 DIAGNOSIS — R402240 Coma scale, best verbal response, confused conversation, unspecified time: Secondary | ICD-10-CM | POA: Diagnosis present

## 2017-09-16 DIAGNOSIS — S069XAA Unspecified intracranial injury with loss of consciousness status unknown, initial encounter: Secondary | ICD-10-CM | POA: Diagnosis present

## 2017-09-16 DIAGNOSIS — W1789XA Other fall from one level to another, initial encounter: Secondary | ICD-10-CM | POA: Diagnosis present

## 2017-09-16 DIAGNOSIS — Z87891 Personal history of nicotine dependence: Secondary | ICD-10-CM | POA: Diagnosis not present

## 2017-09-16 DIAGNOSIS — S065X9A Traumatic subdural hemorrhage with loss of consciousness of unspecified duration, initial encounter: Principal | ICD-10-CM | POA: Diagnosis present

## 2017-09-16 DIAGNOSIS — S02119A Unspecified fracture of occiput, initial encounter for closed fracture: Secondary | ICD-10-CM | POA: Diagnosis present

## 2017-09-16 DIAGNOSIS — R402360 Coma scale, best motor response, obeys commands, unspecified time: Secondary | ICD-10-CM | POA: Diagnosis present

## 2017-09-16 DIAGNOSIS — F10121 Alcohol abuse with intoxication delirium: Secondary | ICD-10-CM | POA: Diagnosis present

## 2017-09-16 DIAGNOSIS — R402140 Coma scale, eyes open, spontaneous, unspecified time: Secondary | ICD-10-CM | POA: Diagnosis present

## 2017-09-16 DIAGNOSIS — F10921 Alcohol use, unspecified with intoxication delirium: Secondary | ICD-10-CM

## 2017-09-16 DIAGNOSIS — S0219XA Other fracture of base of skull, initial encounter for closed fracture: Secondary | ICD-10-CM | POA: Diagnosis present

## 2017-09-16 DIAGNOSIS — S069X9A Unspecified intracranial injury with loss of consciousness of unspecified duration, initial encounter: Secondary | ICD-10-CM | POA: Diagnosis present

## 2017-09-16 DIAGNOSIS — S065XAA Traumatic subdural hemorrhage with loss of consciousness status unknown, initial encounter: Secondary | ICD-10-CM

## 2017-09-16 DIAGNOSIS — S02118A Other fracture of occiput, initial encounter for closed fracture: Secondary | ICD-10-CM

## 2017-09-16 LAB — RAPID URINE DRUG SCREEN, HOSP PERFORMED
Amphetamines: NOT DETECTED
Barbiturates: NOT DETECTED
Benzodiazepines: POSITIVE — AB
Cocaine: NOT DETECTED
Opiates: NOT DETECTED
Tetrahydrocannabinol: POSITIVE — AB

## 2017-09-16 LAB — CBC WITH DIFFERENTIAL/PLATELET
Abs Immature Granulocytes: 0 10*3/uL (ref 0.0–0.1)
BASOS ABS: 0 10*3/uL (ref 0.0–0.1)
BASOS PCT: 1 %
EOS ABS: 0.1 10*3/uL (ref 0.0–0.7)
Eosinophils Relative: 1 %
HCT: 44.4 % (ref 39.0–52.0)
Hemoglobin: 14.8 g/dL (ref 13.0–17.0)
Immature Granulocytes: 0 %
Lymphocytes Relative: 46 %
Lymphs Abs: 4.2 10*3/uL — ABNORMAL HIGH (ref 0.7–4.0)
MCH: 27.8 pg (ref 26.0–34.0)
MCHC: 33.3 g/dL (ref 30.0–36.0)
MCV: 83.5 fL (ref 78.0–100.0)
MONOS PCT: 8 %
Monocytes Absolute: 0.7 10*3/uL (ref 0.1–1.0)
Neutro Abs: 3.8 10*3/uL (ref 1.7–7.7)
Neutrophils Relative %: 44 %
PLATELETS: 311 10*3/uL (ref 150–400)
RBC: 5.32 MIL/uL (ref 4.22–5.81)
RDW: 12.7 % (ref 11.5–15.5)
WBC: 8.8 10*3/uL (ref 4.0–10.5)

## 2017-09-16 LAB — ACETAMINOPHEN LEVEL

## 2017-09-16 LAB — COMPREHENSIVE METABOLIC PANEL
ALK PHOS: 51 U/L (ref 38–126)
ALT: 20 U/L (ref 17–63)
AST: 31 U/L (ref 15–41)
Albumin: 4.1 g/dL (ref 3.5–5.0)
Anion gap: 12 (ref 5–15)
BUN: 12 mg/dL (ref 6–20)
CALCIUM: 8.8 mg/dL — AB (ref 8.9–10.3)
CO2: 22 mmol/L (ref 22–32)
CREATININE: 1.42 mg/dL — AB (ref 0.61–1.24)
Chloride: 108 mmol/L (ref 101–111)
Glucose, Bld: 114 mg/dL — ABNORMAL HIGH (ref 65–99)
Potassium: 2.8 mmol/L — ABNORMAL LOW (ref 3.5–5.1)
Sodium: 142 mmol/L (ref 135–145)
Total Bilirubin: 0.7 mg/dL (ref 0.3–1.2)
Total Protein: 7.4 g/dL (ref 6.5–8.1)

## 2017-09-16 LAB — CK: Total CK: 359 U/L (ref 49–397)

## 2017-09-16 LAB — ETHANOL: Alcohol, Ethyl (B): 293 mg/dL — ABNORMAL HIGH (ref <10)

## 2017-09-16 LAB — MRSA PCR SCREENING: MRSA by PCR: NEGATIVE

## 2017-09-16 LAB — SALICYLATE LEVEL

## 2017-09-16 MED ORDER — HALOPERIDOL LACTATE 5 MG/ML IJ SOLN
INTRAMUSCULAR | Status: AC
  Administered 2017-09-16: 5 mg via INTRAMUSCULAR
  Filled 2017-09-16: qty 1

## 2017-09-16 MED ORDER — SODIUM CHLORIDE 0.9 % IV SOLN
INTRAVENOUS | Status: DC
  Administered 2017-09-16: 07:00:00 via INTRAVENOUS
  Administered 2017-09-17: 1000 mL via INTRAVENOUS

## 2017-09-16 MED ORDER — CEFAZOLIN SODIUM-DEXTROSE 1-4 GM/50ML-% IV SOLN: 1.0000 g | Freq: Three times a day (TID) | INTRAVENOUS | Status: DC

## 2017-09-16 MED ORDER — VITAMIN B-1 100 MG PO TABS
100.0000 mg | ORAL_TABLET | Freq: Every day | ORAL | Status: DC
  Administered 2017-09-16 – 2017-09-17 (×2): 100 mg via ORAL
  Filled 2017-09-16 (×2): qty 1

## 2017-09-16 MED ORDER — SODIUM CHLORIDE 0.9 % IV BOLUS
1000.0000 mL | Freq: Once | INTRAVENOUS | Status: AC
  Administered 2017-09-16: 1000 mL via INTRAVENOUS

## 2017-09-16 MED ORDER — IOHEXOL 300 MG/ML  SOLN
100.0000 mL | Freq: Once | INTRAMUSCULAR | Status: AC | PRN
  Administered 2017-09-16: 100 mL via INTRAVENOUS

## 2017-09-16 MED ORDER — DEXMEDETOMIDINE HCL IN NACL 200 MCG/50ML IV SOLN: 0.2000 ug/kg/h | INTRAVENOUS | Status: DC

## 2017-09-16 MED ORDER — HYDROMORPHONE HCL 2 MG/ML IJ SOLN: 0.5000 mg | INTRAMUSCULAR | Status: DC | PRN

## 2017-09-16 MED ORDER — TETANUS-DIPHTH-ACELL PERTUSSIS 5-2.5-18.5 LF-MCG/0.5 IM SUSP
0.5000 mL | Freq: Once | INTRAMUSCULAR | Status: AC
  Administered 2017-09-16: 0.5 mL via INTRAMUSCULAR

## 2017-09-16 MED ORDER — ONDANSETRON HCL 4 MG/2ML IJ SOLN
4.0000 mg | Freq: Four times a day (QID) | INTRAMUSCULAR | Status: DC | PRN
  Administered 2017-09-16: 4 mg via INTRAVENOUS
  Filled 2017-09-16: qty 2

## 2017-09-16 MED ORDER — POTASSIUM CHLORIDE 10 MEQ/100ML IV SOLN
10.0000 meq | Freq: Once | INTRAVENOUS | Status: AC
  Administered 2017-09-16: 10 meq via INTRAVENOUS
  Filled 2017-09-16: qty 100

## 2017-09-16 MED ORDER — LORAZEPAM 2 MG/ML IJ SOLN
2.0000 mg | Freq: Once | INTRAMUSCULAR | Status: AC
  Administered 2017-09-16: 2 mg via INTRAVENOUS

## 2017-09-16 MED ORDER — ACETAMINOPHEN 325 MG PO TABS
650.0000 mg | ORAL_TABLET | Freq: Four times a day (QID) | ORAL | Status: DC | PRN
  Administered 2017-09-16 – 2017-09-18 (×4): 650 mg via ORAL
  Filled 2017-09-16 (×4): qty 2

## 2017-09-16 MED ORDER — WHITE PETROLATUM EX OINT
TOPICAL_OINTMENT | CUTANEOUS | Status: AC
  Administered 2017-09-16: 0.2 via TOPICAL
  Filled 2017-09-16: qty 28.35

## 2017-09-16 MED ORDER — TETANUS-DIPHTH-ACELL PERTUSSIS 5-2.5-18.5 LF-MCG/0.5 IM SUSP
INTRAMUSCULAR | Status: AC
  Filled 2017-09-16: qty 0.5

## 2017-09-16 MED ORDER — PANTOPRAZOLE SODIUM 40 MG PO TBEC
40.0000 mg | DELAYED_RELEASE_TABLET | Freq: Two times a day (BID) | ORAL | Status: DC
  Administered 2017-09-17 (×2): 40 mg via ORAL
  Filled 2017-09-16 (×3): qty 1

## 2017-09-16 MED ORDER — FAMOTIDINE IN NACL 20-0.9 MG/50ML-% IV SOLN
20.0000 mg | Freq: Two times a day (BID) | INTRAVENOUS | Status: DC
  Administered 2017-09-16 (×2): 20 mg via INTRAVENOUS
  Filled 2017-09-16: qty 50

## 2017-09-16 MED ORDER — LORAZEPAM 2 MG/ML IJ SOLN
INTRAMUSCULAR | Status: AC
  Filled 2017-09-16: qty 1

## 2017-09-16 MED ORDER — ADULT MULTIVITAMIN W/MINERALS CH
1.0000 | ORAL_TABLET | Freq: Every day | ORAL | Status: DC
  Administered 2017-09-16 – 2017-09-17 (×2): 1 via ORAL
  Filled 2017-09-16 (×2): qty 1

## 2017-09-16 MED ORDER — ONDANSETRON 4 MG PO TBDP
4.0000 mg | ORAL_TABLET | Freq: Four times a day (QID) | ORAL | Status: DC | PRN
  Administered 2017-09-17: 4 mg via ORAL
  Filled 2017-09-16 (×2): qty 1

## 2017-09-16 MED ORDER — IOHEXOL 300 MG/ML  SOLN: 100.0000 mL | Freq: Once | INTRAMUSCULAR | Status: DC | PRN

## 2017-09-16 MED ORDER — WHITE PETROLATUM EX OINT
TOPICAL_OINTMENT | CUTANEOUS | Status: DC | PRN
  Administered 2017-09-16: 0.2 via TOPICAL

## 2017-09-16 MED ORDER — HYDROMORPHONE HCL 1 MG/ML IJ SOLN
0.5000 mg | INTRAMUSCULAR | Status: DC | PRN
  Administered 2017-09-16 (×2): 0.5 mg via INTRAVENOUS
  Filled 2017-09-16 (×2): qty 1

## 2017-09-16 MED ORDER — HALOPERIDOL LACTATE 5 MG/ML IJ SOLN
5.0000 mg | Freq: Once | INTRAMUSCULAR | Status: AC
  Administered 2017-09-16: 5 mg via INTRAMUSCULAR

## 2017-09-16 MED ORDER — FOLIC ACID 1 MG PO TABS
1.0000 mg | ORAL_TABLET | Freq: Every day | ORAL | Status: DC
  Administered 2017-09-16 – 2017-09-17 (×2): 1 mg via ORAL
  Filled 2017-09-16 (×2): qty 1
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
## 2017-09-16 NOTE — ED Notes (Signed)
Paged neurosurgery/POOL

## 2017-09-16 NOTE — ED Notes (Signed)
[employer] Case Number [id number]

## 2017-09-16 NOTE — ED Notes (Signed)
Condom Cath Placed on Pt.
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
## 2017-09-16 NOTE — ED Notes (Signed)
James [name] [phone] James [name] [phone]
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
## 2017-09-17 ENCOUNTER — Inpatient Hospital Stay (HOSPITAL_COMMUNITY): Payer: BLUE CROSS/BLUE SHIELD

## 2017-09-17 LAB — CBC
HEMATOCRIT: 41.2 % (ref 39.0–52.0)
Hemoglobin: 13.8 g/dL (ref 13.0–17.0)
MCH: 28 pg (ref 26.0–34.0)
MCHC: 33.5 g/dL (ref 30.0–36.0)
MCV: 83.6 fL (ref 78.0–100.0)
Platelets: 240 10*3/uL (ref 150–400)
RBC: 4.93 MIL/uL (ref 4.22–5.81)
RDW: 12.7 % (ref 11.5–15.5)
WBC: 10 10*3/uL (ref 4.0–10.5)

## 2017-09-17 LAB — BASIC METABOLIC PANEL
Anion gap: 9 (ref 5–15)
BUN: 7 mg/dL (ref 6–20)
CALCIUM: 8.6 mg/dL — AB (ref 8.9–10.3)
CHLORIDE: 109 mmol/L (ref 101–111)
CO2: 23 mmol/L (ref 22–32)
CREATININE: 0.98 mg/dL (ref 0.61–1.24)
GFR calc non Af Amer: >60 mL/min (ref >60)
Glucose, Bld: 99 mg/dL (ref 65–99)
Potassium: 3.5 mmol/L (ref 3.5–5.1)
Sodium: 141 mmol/L (ref 135–145)
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
## 2017-09-18 MED ORDER — TRAMADOL HCL 50 MG PO TABS: 50.0000 mg | ORAL_TABLET | Freq: Four times a day (QID) | ORAL | 1 refills | Status: DC | PRN

## 2017-09-18 NOTE — Discharge Instructions (Signed)
Basilar Skull Fracture A basilar skull fracture is a break or crack in one of the bones that make up the base of the skull. This injury often affects the bones around the ears or nose, under the eyes, or near the upper spine. Usually, the fractured bone does not move out of place. What are the causes? This injury is caused by a severe, direct hit (blow) to the head, such as from a car crash or a fall from a high place. What are the signs or symptoms? Symptoms of this condition may include:  Clear liquid leaking from the nose or an ear.  Blood leaking from an ear.  Sudden loss of hearing or smell.  Blurred vision or double vision.  Trouble with balance or coordination.  Headache.  Nausea or vomiting.  Weakness or numbness in the face.  Bruising around the eyes or behind an ear.  Jerky movements you cannot control (seizures).  How is this diagnosed? This condition may be diagnosed based on:  CT scans.  "Double ring" or "halo" test. This test looks at blood and fluid that leaks from the ear or nose. If the fluid is red with a clear ring around the edge, this indicates that fluid that surrounds the brain and spinal cord (cerebrospinal fluid) may be leaking.  Hearing test.  Nerve test. This may be done to check for any damage to your facial nerves.  How is this treated? In most cases, the fracture can heal without treatment. If you need treatment, it may include:  Observation and rest. You may be admitted to a hospital for close observation by a health care team.  Medicines. These may be given to relieve symptoms such as headaches, seizures, and nausea.  Antibiotic medicines.  Surgery. This is done in severe cases, especially if the fracture fragments are affecting brain tissue or if there is nerve damage.  Follow these instructions at home: Medicines  Take over-the-counter and prescription medicines only as told by your health care provider.  If you were prescribed  an antibiotic, take it as told by your health care provider. Do not stop taking the antibiotic even if you start to feel better. Activity  Rest as told by your health care provider. Ask your health care provider when you can return to your normal activities.  Do not lift anything that is heavier than 10 lb (4.5 kg), or the limit that your health care provider tells you, until he or she says that it is safe.  Do not drive or use heavy machinery until your health care provider says it is okay. General instructions  Have someone stay with you when you go home. This person will need to observe you closely for next couple of days and make sure that you get medical care if you have problems. Ask your health care provider how long someone should observe you.  Do not drink alcohol until your health care provider says it is okay.  Do not blow your nose.  Keep your head raised (elevated) when you are lying down.  Keep all follow-up visits as told by your health care provider. This is important. Contact a health care provider if:  Your symptoms do not improve. Get help right away if:  You develop new or worse symptoms.  You are unusually sleepy (lethargic).  You are not acting normally.  You have a fever or chills.  You have a seizure.  You have confusion.  You have an increased sensitivity to light.  You have increased nausea or vomiting. These symptoms may represent a serious problem that is an emergency. Do not wait to see if the symptoms will go away. Get medical help right away. Call your local emergency services (911 in the [place] Do not drive yourself to the hospital. Summary  A basilar skull fracture is a break or crack in one of the bones that make up the base of the skull. This injury often affects the bones around the ears or nose, under the eyes, or near the upper spine.  This injury is caused by a severe, direct hit (blow) to the head, such as from a car crash or fall  from a high place.  This condition is generally treated with observation and rest. In some cases, medicines may be prescribed to relieve symptoms. In severe cases, surgery may be needed.  When you go home, you must have someone stay with you to observe you closely for next couple of days. Ask your health care provider how long someone should observe you. This information is not intended to replace advice given to you by your health care provider. Make sure you discuss any questions you have with your health care provider. Document Released: 03/30/2011 Document Revised: 02/28/2016 Document Reviewed: 02/28/2016 Elsevier Interactive Patient Education  2017 [employer].

## 2017-09-18 NOTE — Progress Notes (Signed)
Pt discharged to home with significant other. He was wheeled to exit by volunteer. He was given d/c instructions including when to call 911, when to follow up with Dr Jordan Likes ( 2 weeks) and was given a signed prescription for ultram. All d/c instructions discussed, all education given. A work note was given stating that he could return in 2 weeks after following up with Dr Jordan Likes.

## 2017-09-18 NOTE — Discharge Summary (Signed)
Physician Discharge Summary  Patient ID: James [name] MRN: [id number] DOB/AGE: 1990-10-09 27 y.o.  Admit date: 09/16/2017 Discharge date: 09/18/2017  Admission Diagnoses:  Discharge Diagnoses:  Active Problems:   TBI (traumatic brain injury) [facility])   Discharged Condition: good  Hospital Course: Patient admitted to the hospital where he underwent treatment for a traumatic brain injury.  Patient with significant bifrontal contusions which have been stable on serial CT scanning's.  Patient currently without headache.  Behavior at baseline.  No neurologic symptoms.  No other evidence of injury.  Consults:   Significant Diagnostic Studies:   Treatments:   Discharge Exam: Blood pressure (!) 142/78, pulse (!) 51, temperature 98.3 F (36.8 C), temperature source Oral, resp. rate 10, height  (1.702 m), weight 85.6 kg (188 lb 11.4 oz), SpO2 98 %. Patient is awake and alert.  He is oriented and appropriate.  His speech is fluent.  His judgment and insight are intact.  Cranial nerve function normal bilateral.  Motor 5/5.  No pronator drift.  Chest and abdomen benign.  Extremities free from injury deformity.  Disposition: Discharge disposition: 01-Home or Self Care        Allergies as of 09/18/2017   No Known Allergies     Medication List    TAKE these medications   traMADol 50 MG tablet Commonly known as:  ULTRAM Take 1-2 tablets (50-100 mg total) by mouth every 6 (six) hours as needed.        Signed: Kathaleen Maser Alisia Vanengen 09/18/2017, 10:38 AM

## 2017-09-20 ENCOUNTER — Emergency Department (HOSPITAL_COMMUNITY)
Admission: EM | Admit: 2017-09-20 | Discharge: 2017-09-20 | Disposition: A | Discharge status: Home / Self Care | Payer: BLUE CROSS/BLUE SHIELD | Attending: Emergency Medicine | Admitting: Emergency Medicine

## 2017-09-20 ENCOUNTER — Emergency Department (HOSPITAL_COMMUNITY): Payer: BLUE CROSS/BLUE SHIELD

## 2017-09-20 ENCOUNTER — Other Ambulatory Visit: Payer: Self-pay

## 2017-09-20 DIAGNOSIS — Z87891 Personal history of nicotine dependence: Secondary | ICD-10-CM | POA: Diagnosis not present

## 2017-09-20 DIAGNOSIS — G44319 Acute post-traumatic headache, not intractable: Secondary | ICD-10-CM | POA: Diagnosis not present

## 2017-09-20 DIAGNOSIS — R51 Headache: Secondary | ICD-10-CM | POA: Diagnosis present

## 2017-09-20 DIAGNOSIS — R112 Nausea with vomiting, unspecified: Secondary | ICD-10-CM | POA: Diagnosis not present

## 2017-09-20 LAB — BASIC METABOLIC PANEL
Anion gap: 11 (ref 5–15)
BUN: 7 mg/dL (ref 6–20)
CHLORIDE: 101 mmol/L (ref 101–111)
CO2: 24 mmol/L (ref 22–32)
CREATININE: 0.94 mg/dL (ref 0.61–1.24)
Calcium: 9.4 mg/dL (ref 8.9–10.3)
GFR calc Af Amer: >60 mL/min (ref >60)
GFR calc non Af Amer: >60 mL/min (ref >60)
Glucose, Bld: 128 mg/dL — ABNORMAL HIGH (ref 65–99)
Potassium: 3.2 mmol/L — ABNORMAL LOW (ref 3.5–5.1)
Sodium: 136 mmol/L (ref 135–145)

## 2017-09-20 LAB — CBC WITH DIFFERENTIAL/PLATELET
Abs Immature Granulocytes: 0 10*3/uL (ref 0.0–0.1)
Basophils Absolute: 0 10*3/uL (ref 0.0–0.1)
Basophils Relative: 0 %
EOS ABS: 0 10*3/uL (ref 0.0–0.7)
EOS PCT: 1 %
HEMATOCRIT: 43.5 % (ref 39.0–52.0)
HEMOGLOBIN: 15 g/dL (ref 13.0–17.0)
IMMATURE GRANULOCYTES: 0 %
LYMPHS ABS: 1 10*3/uL (ref 0.7–4.0)
Lymphocytes Relative: 12 %
MCH: 28.3 pg (ref 26.0–34.0)
MCHC: 34.5 g/dL (ref 30.0–36.0)
MCV: 82.1 fL (ref 78.0–100.0)
MONOS PCT: 7 %
Monocytes Absolute: 0.6 10*3/uL (ref 0.1–1.0)
NEUTROS PCT: 80 %
Neutro Abs: 6.6 10*3/uL (ref 1.7–7.7)
Platelets: 304 10*3/uL (ref 150–400)
RBC: 5.3 MIL/uL (ref 4.22–5.81)
RDW: 12.3 % (ref 11.5–15.5)
WBC: 8.2 10*3/uL (ref 4.0–10.5)

## 2017-09-20 LAB — APTT: APTT: 27 s (ref 24–36)

## 2017-09-20 LAB — PROTIME-INR
INR: 0.95
Prothrombin Time: 12.6 seconds (ref 11.4–15.2)

## 2017-09-20 MED ORDER — MORPHINE SULFATE (PF) 4 MG/ML IV SOLN
4.0000 mg | Freq: Once | INTRAVENOUS | Status: AC
  Administered 2017-09-20: 4 mg via INTRAVENOUS
  Filled 2017-09-20: qty 1

## 2017-09-20 MED ORDER — ONDANSETRON 4 MG PO TBDP: 4.0000 mg | ORAL_TABLET | Freq: Three times a day (TID) | ORAL | 0 refills | Status: DC | PRN

## 2017-09-20 MED ORDER — HYDROCODONE-ACETAMINOPHEN 5-325 MG PO TABS: 1.0000 | ORAL_TABLET | Freq: Four times a day (QID) | ORAL | 0 refills | Status: DC | PRN

## 2017-09-20 MED ORDER — ONDANSETRON HCL 4 MG/2ML IJ SOLN
4.0000 mg | Freq: Once | INTRAMUSCULAR | Status: AC
  Administered 2017-09-20: 4 mg via INTRAVENOUS
  Filled 2017-09-20: qty 2

## 2017-09-20 NOTE — ED Notes (Signed)
Pt reports a decrease in pain at this time; pt also denies any further n/v at this time

## 2017-09-20 NOTE — ED Notes (Signed)
Pt going to ct scan at this time  

## 2017-09-20 NOTE — Discharge Instructions (Signed)
It was my pleasure taking care of you today!   Zofran as needed for nausea. Pain medication only as needed for severe pain. This is to take INSTEAD OF your tramadol. Do not take both.   Follow up with the neurosurgery clinic.   Return to ER for new or worsening symptoms, any additional concerns.

## 2017-09-20 NOTE — ED Provider Notes (Signed)
[facility] EMERGENCY DEPARTMENT Provider Note   CSN: [id number] Arrival date & time: 09/20/17  0813     History   Chief Complaint Chief Complaint  Patient presents with  . Headache    HPI James [name] is a 27 y.o. male.  The history is provided by the patient and medical records. No language interpreter was used.  Headache   Associated symptoms include nausea and vomiting.   James [name] is a 27 y.o. male who presents to the Emergency Department complaining of progressively worsening headache, mostly left-sided, since discharge from the hospital on 5/28.  Associated symptoms include vomiting which began late last night versus early this morning.  He has been taking his tramadol with no improvement in symptoms.  Mother at bedside states that he has been up ambulating as usual. He has been able to answer all questions appropriately for her mother, she just feels like he is a little "more out of it than usual".  He also has been complaining of headaches much more frequently today.  Of note, patient was admitted on 5/26 for subdural hematoma and closed occipital and temporal bone fractures.  He was followed by neurosurgery and subsequently discharged 2 days later after observation.  Repeat head CT prior to discharge was stable.  He was instructed to follow-up with neurosurgery in 2 weeks for recheck in the office.  No past medical history on file.  Patient Active Problem List   Diagnosis Date Noted  . TBI (traumatic brain injury) (HCC) 09/16/2017  . 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      Home Medications    Prior to Admission medications   Medication Sig Start Date End Date Taking? Authorizing Provider  acetaminophen (TYLENOL) 325 MG tablet Take 650 mg by mouth every 6 (six) hours as needed for mild pain.   Yes [provider]  traMADol  (ULTRAM) 50 MG tablet Take 1-2 tablets (50-100 mg total) by mouth every 6 (six) hours as needed. Patient taking differently: Take 100 mg by mouth every 6 (six) hours.  09/18/17  Yes Pool, Sherilyn Cooter, MD  HYDROcodone-acetaminophen (NORCO/VICODIN) 5-325 MG tablet Take 1 tablet by mouth every 6 (six) hours as needed for severe pain. 09/20/17   Bach Rocchi, Chase Picket, PA-C  ondansetron (ZOFRAN ODT) 4 MG disintegrating tablet Take 1 tablet (4 mg total) by mouth every 8 (eight) hours as needed for nausea or vomiting. 09/20/17   Jaimi Belle, Chase Picket, PA-C    Family History No family history on file.  Social History Social History   Tobacco Use  . Smoking status: Former Smoker    Packs/day: 0.50    Types: Cigarettes  . Smokeless tobacco: Never Used  Substance Use Topics  . Alcohol use: Yes    Comment: every day  . Drug use: Yes    Types: Marijuana     Allergies   Patient has no known allergies.   Review of Systems Review of Systems  Gastrointestinal: Positive for nausea and vomiting.  Neurological: Positive for headaches.  All other systems reviewed and are negative.    Physical Exam Updated Vital Signs BP (!) 154/103   Pulse (!) 53   Temp 98.2 F (36.8 C) (Oral)   Resp 17   SpO2 96%   Physical Exam  Constitutional: He is oriented to person, place, and time. He appears well-developed and well-nourished. No distress.  HENT:  Head:  Normocephalic.  Right Ear: No hemotympanum.  Left Ear: There is hemotympanum.  Cardiovascular: Normal rate, regular rhythm and normal heart sounds.  No murmur heard. Pulmonary/Chest: Effort normal and breath sounds normal. No respiratory distress.  Abdominal: Soft. He exhibits no distension. There is no tenderness.  Musculoskeletal:  5/5 muscle strength in all four extremities including grip strength.  Neurological: He is alert and oriented to person, place, and time.  Alert, oriented, thought content appropriate, able to give a coherent history.  Speech is clear and goal oriented, able to follow commands.  Cranial Nerves:  II:  Peripheral visual fields grossly normal, pupils equal, round, reactive to light III, IV, VI: EOM intact bilaterally, ptosis not present V,VII: smile symmetric, eyes kept closed tightly against resistance, facial light touch sensation equal VIII: hearing grossly normal IX, X: symmetric soft palate movement, uvula elevates symmetrically  XI: bilateral shoulder shrug symmetric and strong XII: midline tongue extension Sensory to light touch normal in all four extremities.  Normal finger-to-nose and rapid alternating movements; normal gait and balance.  Skin: Skin is warm and dry.  Nursing note and vitals reviewed.    ED Treatments / Results  Labs (all labs ordered are listed, but only abnormal results are displayed) Labs Reviewed  BASIC METABOLIC PANEL - Abnormal; Notable for the following components:      Result Value   Potassium 3.2 (*)    Glucose, Bld 128 (*)    All other components within normal limits  CBC WITH DIFFERENTIAL/PLATELET  PROTIME-INR  APTT    EKG None  Radiology Ct Head Wo Contrast  Result Date: 09/20/2017 CLINICAL DATA:  Recent closed head injury. Worsening left-sided headache EXAM: CT HEAD WITHOUT CONTRAST TECHNIQUE: Contiguous axial images were obtained from the base of the skull through the vertex without intravenous contrast. COMPARISON:  CT head 09/17/2017 FINDINGS: Brain: Hemorrhagic contusions in the inferior frontal lobes left greater than right unchanged. Small left frontal subdural hematoma is improved. Small right tentorial subdural hematoma improved. Hemorrhagic contusion in the left inferior temporal lobe with surrounding edema unchanged. No new area of hemorrhage. Negative for hydrocephalus. Negative for acute infarct or mass Vascular: Negative for hyperdense vessel Skull: Improvement in diastatic fracture of the lambdoid suture on the left. Resolution of intracranial  gas. Sinuses/Orbits: Negative Other: None IMPRESSION: 1. Hemorrhagic contusions in both frontal lobes and left temporal lobe unchanged. Improvement in small left frontal subdural hematoma. Small right tentorial subdural hematoma improved. 2. No new area of hemorrhage.  Negative for hydrocephalus. Electronically Signed   By: Marlan Palau M.D.   On: 09/20/2017 09:43    Procedures Procedures (including critical care time)  Medications Ordered in ED Medications  ondansetron (ZOFRAN) injection 4 mg (4 mg Intravenous Given 09/20/17 0931)  morphine 4 MG/ML injection 4 mg (4 mg Intravenous Given 09/20/17 0931)     Initial Impression / Assessment and Plan / ED Course  I have reviewed the triage vital signs and the nursing notes.  Pertinent labs & imaging results that were available during my care of the patient were reviewed by me and considered in my medical decision making (see chart for details).    James [name] is a 27 y.o. male who presents to ED for worsening headache, nausea/vomiting since discharge from hospital on 5/28.  Chart reviewed.  Briefly, patient admitted for nondisplaced occipital and temporal bone fractures and subdural hematoma from traumatic injury on 5/26.  Repeat head CT on 27th stable and discharged on the 28th with recommendation  to follow-up with neurosurgery clinic in 2 weeks.  No focal neuro deficits on examination today.  Hemotympanum of the left ear noted on exam.  Repeat head CT obtained today showing no new areas of hemorrhage and no hydrocephalus.  He does still have stable hemorrhagic contusions as well as improvement in subdural hematomas.  Case discussed with neurosurgery, Dr. Dutch Quint, who recommends reassurance, symptomatic management and outpatient follow-up.  Plan of care discussed with patient and mother at bedside who are in agreement.  Reasons to return to ER discussed and all questions answered.  Patient seen by and discussed with Dr. Rush Landmark who agrees with  treatment plan.    Final Clinical Impressions(s) / ED Diagnoses   Final diagnoses:  Acute post-traumatic headache, not intractable    ED Discharge Orders        Ordered    HYDROcodone-acetaminophen (NORCO/VICODIN) 5-325 MG tablet  Every 6 hours PRN     09/20/17 1044    ondansetron (ZOFRAN ODT) 4 MG disintegrating tablet  Every 8 hours PRN     09/20/17 1044       Keddrick Wyne, Chase Picket, PA-C 09/20/17 1055    Tegeler, Canary Brim, MD 09/20/17 [phone]

## 2017-12-21 ENCOUNTER — Ambulatory Visit (INDEPENDENT_AMBULATORY_CARE_PROVIDER_SITE_OTHER): Payer: BLUE CROSS/BLUE SHIELD

## 2017-12-21 ENCOUNTER — Encounter (HOSPITAL_COMMUNITY): Payer: Self-pay

## 2017-12-21 ENCOUNTER — Ambulatory Visit (HOSPITAL_COMMUNITY)
Admission: EM | Admit: 2017-12-21 | Discharge: 2017-12-21 | Disposition: A | Discharge status: Home / Self Care | Payer: BLUE CROSS/BLUE SHIELD | Attending: Internal Medicine | Admitting: Internal Medicine

## 2017-12-21 DIAGNOSIS — S92514A Nondisplaced fracture of proximal phalanx of right lesser toe(s), initial encounter for closed fracture: Secondary | ICD-10-CM

## 2017-12-21 DIAGNOSIS — W2203XA Walked into furniture, initial encounter: Secondary | ICD-10-CM | POA: Diagnosis not present

## 2017-12-21 MED ORDER — FLUTICASONE PROPIONATE 50 MCG/ACT NA SUSP: 1.0000 | Freq: Every day | NASAL | 2 refills | Status: DC

## 2017-12-21 NOTE — ED Triage Notes (Signed)
Pt presents with complaints of pain in right pinky toe after hitting it while playing hide and seek.

## 2017-12-21 NOTE — Discharge Instructions (Signed)
It was nice meeting you!!  We will buddy tape the toes and give you a post op boot.  Follow up as needed for continued or worsening symptoms

## 2017-12-21 NOTE — ED Provider Notes (Signed)
[facility]    CSN: [id number] Arrival date & time: 12/21/17  1324     History   Chief Complaint Chief Complaint  Patient presents with  . Foot Pain    HPI James [name] is a 27 y.o. male.   Patient is a 27 year old male presents with 2 days of right fifth toe pain and swelling.  This started after he struck his toe on a bed playing hide and seek.  The pain and swelling has increased since the incident.  He has been able to walk on the foot but complains of pain with wearing his shoes.  He denies any numbness, tingling or loss of sensation.  He denies any open wounds or bleeding.  He has not taken any medication for his symptoms   He does not smoke,  Mom and dad are healthy  ROS per HPI      History reviewed. No pertinent past medical history.  Patient Active Problem List   Diagnosis Date Noted  . TBI (traumatic brain injury) (HCC) 09/16/2017  . 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 No pertinent surgical history.     Home Medications    Prior to Admission medications   Medication Sig Start Date End Date Taking? Authorizing Provider  acetaminophen (TYLENOL) 325 MG tablet Take 650 mg by mouth every 6 (six) hours as needed for mild pain.    [provider]  fluticasone (FLONASE) 50 MCG/ACT nasal spray Place 1 spray into both nostrils daily. 12/21/17   Dahlia ByesBast, Macrae Wiegman A, NP  HYDROcodone-acetaminophen (NORCO/VICODIN) 5-325 MG tablet Take 1 tablet by mouth every 6 (six) hours as needed for severe pain. 09/20/17   Ward, Chase PicketJaime Pilcher, PA-C  ondansetron (ZOFRAN ODT) 4 MG disintegrating tablet Take 1 tablet (4 mg total) by mouth every 8 (eight) hours as needed for nausea or vomiting. 09/20/17   Ward, Chase PicketJaime Pilcher, PA-C  traMADol (ULTRAM) 50 MG tablet Take 1-2 tablets (50-100 mg total) by mouth every 6 (six) hours as needed. Patient taking differently: Take 100 mg by  mouth every 6 (six) hours.  09/18/17   Julio SicksPool, Henry, MD    Family History Family History  Problem Relation Age of Onset  . Healthy Mother   . Healthy Father     Social History Social History   Tobacco Use  . Smoking status: Former Smoker    Packs/day: 0.50    Types: Cigarettes  . Smokeless tobacco: Never Used  Substance Use Topics  . Alcohol use: Yes    Comment: every day  . Drug use: Yes    Types: Marijuana     Allergies   Patient has no known allergies.   Review of Systems Review of Systems   Physical Exam Triage Vital Signs ED Triage Vitals  Enc Vitals Group     BP 12/21/17 1416 (!) 147/90     Pulse Rate 12/21/17 1416 85     Resp 12/21/17 1416 18     Temp 12/21/17 1416 (!) 97.2 F (36.2 C)     Temp src --      SpO2 12/21/17 1416 96 %     Weight --      Height --      Head Circumference --      Peak Flow --      Pain Score 12/21/17 1417 10     Pain Loc --      Pain Edu? --  Excl. in GC? --    No data found.  Updated Vital Signs BP (!) 147/90   Pulse 85   Temp (!) 97.2 F (36.2 C)   Resp 18   SpO2 96%   Visual Acuity Right Eye Distance:   Left Eye Distance:   Bilateral Distance:    Right Eye Near:   Left Eye Near:    Bilateral Near:     Physical Exam  Constitutional: He is oriented to person, place, and time. He appears well-developed and well-nourished.  .  Pleasant.  Nontoxic or ill-appearing  HENT:  Head: Normocephalic and atraumatic.  Nose: Nose normal.  Eyes: Conjunctivae are normal.  Neck: Normal range of motion.  Pulmonary/Chest: Effort normal.  Musculoskeletal: He exhibits edema and tenderness. He exhibits no deformity.  Pt has swelling, bruising to the right 5 phalanx. He is able to flex and extend the toe with pain.   Neurological: He is alert and oriented to person, place, and time.  Skin: Skin is warm and dry.  Psychiatric: He has a normal mood and affect.  Nursing note and vitals reviewed.    UC Treatments /  Results  Labs (all labs ordered are listed, but only abnormal results are displayed) Labs Reviewed - No data to display  EKG None  Radiology Dg Foot Complete Right  Result Date: 12/21/2017 CLINICAL DATA:  Right little toe pain after hitting a bed post last week. EXAM: RIGHT FOOT COMPLETE - 3+ VIEW COMPARISON:  None. FINDINGS: Nondisplaced fracture of the fifth proximal phalanx diaphysis. No dislocation. Joint spaces are preserved. Bone mineralization is normal. Soft tissues are unremarkable. IMPRESSION: 1. Nondisplaced fracture of the fifth proximal phalanx. Electronically Signed   By: Obie Dredge M.D.   On: 12/21/2017 15:04    Procedures Procedures (including critical care time)  Medications Ordered in UC Medications - No data to display  Initial Impression / Assessment and Plan / UC Course  I have reviewed the triage vital signs and the nursing notes.  Pertinent labs & imaging results that were available during my care of the patient were reviewed by me and considered in my medical decision making (see chart for details).     X-ray showed nondisplaced fracture of the proximal fifth phalanx of the right foot.  Will buddy tape toes and use postop shoe. Ice, elevate Tylenol or ibuprofen for pain Follow up as needed for continued or worsening symptoms  Final Clinical Impressions(s) / UC Diagnoses   Final diagnoses:  Closed nondisplaced fracture of proximal phalanx of lesser toe of right foot, initial encounter     Discharge Instructions     It was nice meeting you!!  We will buddy tape the toes and give you a post op boot.  Follow up as needed for continued or worsening symptoms     ED Prescriptions    Medication Sig Dispense Auth. Provider   fluticasone (FLONASE) 50 MCG/ACT nasal spray Place 1 spray into both nostrils daily. 16 g Dahlia Byes A, NP     Controlled Substance Prescriptions [place] Controlled Substance Registry consulted? Not Applicable   Janace Aris, NP 12/21/17 [phone]

## 2018-05-13 ENCOUNTER — Ambulatory Visit (HOSPITAL_COMMUNITY)
Admission: EM | Admit: 2018-05-13 | Discharge: 2018-05-13 | Disposition: A | Discharge status: Home / Self Care | Payer: BLUE CROSS/BLUE SHIELD | Attending: Internal Medicine | Admitting: Internal Medicine

## 2018-05-13 ENCOUNTER — Encounter (HOSPITAL_COMMUNITY): Payer: Self-pay | Admitting: Emergency Medicine

## 2018-05-13 ENCOUNTER — Other Ambulatory Visit: Payer: Self-pay

## 2018-05-13 DIAGNOSIS — B349 Viral infection, unspecified: Secondary | ICD-10-CM | POA: Insufficient documentation

## 2018-05-13 MED ORDER — OSELTAMIVIR PHOSPHATE 75 MG PO CAPS: 75.0000 mg | ORAL_CAPSULE | Freq: Two times a day (BID) | ORAL | 0 refills | Status: DC

## 2018-05-13 NOTE — ED Triage Notes (Signed)
Symptoms started last Thursday.  Runny nose, cough, chest congestion.    Requesting std check up.  Denies penile discharge

## 2018-05-13 NOTE — ED Provider Notes (Signed)
[facility]    CSN: [id number] Arrival date & time: 05/13/18  1512     History   Chief Complaint Chief Complaint  Patient presents with  . URI    HPI James [name] is a 28 y.o. male.   28 yo male with no chronic medical problems presents to urgent care complaining of nasal congestion, cough and mild sore throat. He reports subject fever a few days ago.  He denies nausea, vomiting or diarrhea.  Admits that his daughter was diagnosed with flu last week. Also interested in STD testing.  Denies penile discharge     History reviewed. No pertinent past medical history.  Patient Active Problem List   Diagnosis Date Noted  . TBI (traumatic brain injury) (HCC) 09/16/2017  . 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 HAND SURGERY         Home Medications    Prior to Admission medications   Medication Sig Start Date End Date Taking? Authorizing Provider  Pseudoeph-CPM-DM-APAP (TYLENOL COLD PO) Take by mouth.   Yes [provider]  acetaminophen (TYLENOL) 325 MG tablet Take 650 mg by mouth every 6 (six) hours as needed for mild pain.    [provider]  fluticasone (FLONASE) 50 MCG/ACT nasal spray Place 1 spray into both nostrils daily. 12/21/17   Dahlia ByesBast, Traci A, NP  HYDROcodone-acetaminophen (NORCO/VICODIN) 5-325 MG tablet Take 1 tablet by mouth every 6 (six) hours as needed for severe pain. 09/20/17   Ward, Chase PicketJaime Pilcher, PA-C  ondansetron (ZOFRAN ODT) 4 MG disintegrating tablet Take 1 tablet (4 mg total) by mouth every 8 (eight) hours as needed for nausea or vomiting. 09/20/17   Ward, Chase PicketJaime Pilcher, PA-C  oseltamivir (TAMIFLU) 75 MG capsule Take 1 capsule (75 mg total) by mouth every 12 (twelve) hours. 05/13/18   Arnaldo Nataliamond, Madason Rauls S, MD  traMADol (ULTRAM) 50 MG tablet Take 1-2 tablets (50-100 mg total) by mouth every 6 (six) hours as  needed. Patient taking differently: Take 100 mg by mouth every 6 (six) hours.  09/18/17   Julio SicksPool, Henry, MD    Family History Family History  Problem Relation Age of Onset  . Healthy Mother   . Healthy Father     Social History Social History   Tobacco Use  . Smoking status: Former Smoker    Packs/day: 0.50    Types: Cigarettes  . Smokeless tobacco: Never Used  Substance Use Topics  . Alcohol use: Yes    Comment: every day  . Drug use: Yes    Types: Marijuana     Allergies   Patient has no known allergies.   Review of Systems Review of Systems  Constitutional: Negative for chills and fever.  HENT: Positive for congestion. Negative for sore throat and tinnitus.   Eyes: Negative for redness.  Respiratory: Negative for cough and shortness of breath.   Cardiovascular: Negative for chest pain and palpitations.  Gastrointestinal: Negative for abdominal pain, diarrhea, nausea and vomiting.  Genitourinary: Negative for dysuria, frequency and urgency.  Musculoskeletal: Negative for myalgias.  Skin: Negative for rash.       No lesions  Neurological: Negative for weakness.  Hematological: Does not bruise/bleed easily.  Psychiatric/Behavioral: Negative for suicidal ideas.     Physical Exam Triage Vital Signs ED Triage Vitals  Enc Vitals Group     BP 05/13/18 1601 (!) 143/77     Pulse Rate  05/13/18 1601 (!) 104     Resp 05/13/18 1601 16     Temp 05/13/18 1601 99.1 F (37.3 C)     Temp Source 05/13/18 1601 Temporal     SpO2 05/13/18 1601 98 %     Weight --      Height --      Head Circumference --      Peak Flow --      Pain Score 05/13/18 1557 8     Pain Loc --      Pain Edu? --      Excl. in GC? --    No data found.  Updated Vital Signs BP (!) 143/77 (BP Location: Right Arm)   Pulse (!) 104   Temp 99.1 F (37.3 C) (Temporal)   Resp 16   SpO2 98%   Visual Acuity Right Eye Distance:   Left Eye Distance:   Bilateral Distance:    Right Eye Near:     Left Eye Near:    Bilateral Near:     Physical Exam Constitutional:      General: He is not in acute distress.    Appearance: He is well-developed.  HENT:     Head: Normocephalic and atraumatic.  Eyes:     General: No scleral icterus.    Conjunctiva/sclera: Conjunctivae normal.     Pupils: Pupils are equal, round, and reactive to light.  Neck:     Musculoskeletal: Normal range of motion and neck supple.     Thyroid: No thyromegaly.     Vascular: No JVD.     Trachea: No tracheal deviation.  Cardiovascular:     Rate and Rhythm: Normal rate and regular rhythm.     Heart sounds: Normal heart sounds. No murmur. No friction rub. No gallop.   Pulmonary:     Effort: Pulmonary effort is normal. No respiratory distress.     Breath sounds: Normal breath sounds.  Abdominal:     General: Bowel sounds are normal. There is no distension.     Palpations: Abdomen is soft.     Tenderness: There is no abdominal tenderness.  Musculoskeletal: Normal range of motion.  Lymphadenopathy:     Cervical: No cervical adenopathy.  Skin:    General: Skin is warm and dry.     Findings: No erythema or rash.  Neurological:     Mental Status: He is alert and oriented to person, place, and time.     Cranial Nerves: No cranial nerve deficit.  Psychiatric:        Behavior: Behavior normal.        Thought Content: Thought content normal.        Judgment: Judgment normal.      UC Treatments / Results  Labs (all labs ordered are listed, but only abnormal results are displayed) Labs Reviewed  HIV ANTIBODY (ROUTINE TESTING W REFLEX)  RPR  URINE CYTOLOGY ANCILLARY ONLY    EKG None  Radiology No results found.  Procedures Procedures (including critical care time)  Medications Ordered in UC Medications - No data to display  Initial Impression / Assessment and Plan / UC Course  I have reviewed the triage vital signs and the nursing notes.  Pertinent labs & imaging results that were available  during my care of the patient were reviewed by me and considered in my medical decision making (see chart for details).     Afebrile today but known exposure and feeling unwell.  Will tx w/ Tamiflu.  Call  with STD results Final Clinical Impressions(s) / UC Diagnoses   Final diagnoses:  Viral syndrome   Discharge Instructions   None    ED Prescriptions    Medication Sig Dispense Auth. Provider   oseltamivir (TAMIFLU) 75 MG capsule Take 1 capsule (75 mg total) by mouth every 12 (twelve) hours. 10 capsule Arnaldo Nataliamond, Rhiley Solem S, MD     Controlled Substance Prescriptions [place] Controlled Substance Registry consulted? Not Applicable   Arnaldo Nataliamond, Cono Gebhard S, MD 05/13/18 [phone]

## 2018-05-14 LAB — URINE CYTOLOGY ANCILLARY ONLY
Chlamydia: NEGATIVE
Neisseria Gonorrhea: NEGATIVE
Trichomonas: NEGATIVE

## 2018-05-14 LAB — HIV ANTIBODY (ROUTINE TESTING W REFLEX): HIV Screen 4th Generation wRfx: NONREACTIVE

## 2018-05-14 LAB — RPR: RPR Ser Ql: NONREACTIVE

## 2018-05-16 ENCOUNTER — Telehealth (HOSPITAL_COMMUNITY): Payer: Self-pay | Admitting: Emergency Medicine

## 2018-05-16 NOTE — Telephone Encounter (Signed)
Pt called requesting I mail him his results. Pt informed he needs to pick them up here. Pt agreeable to plan.

## 2018-07-29 ENCOUNTER — Ambulatory Visit: Payer: Self-pay | Admitting: *Deleted

## 2018-07-29 NOTE — Telephone Encounter (Signed)
Pt is not established with a PCP.    He said he called Primary Care at [facility] when I asked him the number.   He told me the number and that's how I knew he called [place].    sometimes I get migraines.  I take aspirin or ibuprofen.  He mainly called in c/o hearing voices since last October.   When asked if they want him to harm anyone he said,   "No"..  He c/o the voices waking him up at night too.   See triage notes.  Since he needs help as soon as possible I asked him if he had ever been to the ED at either hospital in town and he said, "No".  (In his record he has been seen in the ED at [facility] and a few other places.). I suggested he go to the [facility] ED or the [facility].   He decided he will have someone take him to the [facility].   I told him where it was across the street from [facility] hospital. He had a very flat affect during the call.       Reason for Disposition . Seeing, hearing, or feeling things that are not there (i.e., visual, auditory, or tactile hallucinations)  Answer Assessment - Initial Assessment Questions 1. LEVEL OF CONSCIOUSNESS: "How is he (she, the patient) acting right now?" (e.g., alert-oriented, confused, lethargic, stuporous, comatose)    Started in Oct 2019.  I started hearing voices.   It's like someone is talking to me.   The voices don't make me afraid.   Sometimes I can't get sleep.   I wake during the night.   2. ONSET: "When did the confusion start?"  (minutes, hours, days)     Last October 2019.   Never been on medications for this. 3. PATTERN "Does this come and go, or has it been constant since it started?"  "Is it present now?"     The voices are all the time. 4. ALCOHOL or DRUGS: "Has he been drinking alcohol or taking any drugs?"      No.   I have stopped pot in Jan.   When I drink or smoke I hear the voices louder. 5. NARCOTIC MEDICATIONS: "Has he been receiving any narcotic medications?" (e.g., morphine, Vicodin)  No 6. CAUSE: "What do you think is causing the confusion?"     I don't know 7. OTHER SYMPTOMS: "Are there any other symptoms?" (e.g., difficulty breathing, headache, fever, weakness)     No  Protocols used: CONFUSION - DELIRIUM-A-AH

## 2018-07-30 ENCOUNTER — Other Ambulatory Visit: Payer: Self-pay

## 2018-07-30 ENCOUNTER — Other Ambulatory Visit: Payer: Self-pay | Admitting: Registered Nurse

## 2018-07-30 ENCOUNTER — Inpatient Hospital Stay (HOSPITAL_COMMUNITY)
Admission: RE | Admit: 2018-07-30 | Discharge: 2018-08-01 | DRG: 885 | Disposition: A | Discharge status: Home / Self Care | Payer: No Typology Code available for payment source | Attending: Psychiatry | Admitting: Psychiatry

## 2018-07-30 ENCOUNTER — Encounter (HOSPITAL_COMMUNITY): Payer: Self-pay

## 2018-07-30 DIAGNOSIS — F12251 Cannabis dependence with psychotic disorder with hallucinations: Secondary | ICD-10-CM | POA: Diagnosis present

## 2018-07-30 DIAGNOSIS — F19251 Other psychoactive substance dependence with psychoactive substance-induced psychotic disorder with hallucinations: Secondary | ICD-10-CM | POA: Diagnosis present

## 2018-07-30 DIAGNOSIS — F19951 Other psychoactive substance use, unspecified with psychoactive substance-induced psychotic disorder with hallucinations: Secondary | ICD-10-CM | POA: Diagnosis not present

## 2018-07-30 DIAGNOSIS — Z87891 Personal history of nicotine dependence: Secondary | ICD-10-CM | POA: Diagnosis not present

## 2018-07-30 DIAGNOSIS — F2081 Schizophreniform disorder: Principal | ICD-10-CM | POA: Diagnosis present

## 2018-07-30 DIAGNOSIS — F23 Brief psychotic disorder: Secondary | ICD-10-CM | POA: Diagnosis not present

## 2018-07-30 DIAGNOSIS — Z87820 Personal history of traumatic brain injury: Secondary | ICD-10-CM

## 2018-07-30 DIAGNOSIS — R440 Auditory hallucinations: Secondary | ICD-10-CM | POA: Diagnosis present

## 2018-07-30 DIAGNOSIS — F19929 Other psychoactive substance use, unspecified with intoxication, unspecified: Secondary | ICD-10-CM | POA: Diagnosis not present

## 2018-07-30 LAB — URINALYSIS, COMPLETE (UACMP) WITH MICROSCOPIC
Bacteria, UA: NONE SEEN
Bilirubin Urine: NEGATIVE
Glucose, UA: NEGATIVE mg/dL
Hgb urine dipstick: NEGATIVE
Ketones, ur: NEGATIVE mg/dL
Nitrite: NEGATIVE
Protein, ur: NEGATIVE mg/dL
Specific Gravity, Urine: 1.021 (ref 1.005–1.030)
pH: 7 (ref 5.0–8.0)

## 2018-07-30 LAB — RAPID URINE DRUG SCREEN, HOSP PERFORMED
Amphetamines: POSITIVE — AB
Barbiturates: NOT DETECTED
Benzodiazepines: NOT DETECTED
Cocaine: NOT DETECTED
Opiates: NOT DETECTED
Tetrahydrocannabinol: NOT DETECTED

## 2018-07-30 MED ORDER — HALOPERIDOL 5 MG PO TABS: 5.0000 mg | ORAL_TABLET | Freq: Four times a day (QID) | ORAL | Status: DC | PRN

## 2018-07-30 MED ORDER — ALUM & MAG HYDROXIDE-SIMETH 200-200-20 MG/5ML PO SUSP: 30.0000 mL | ORAL | Status: DC | PRN

## 2018-07-30 MED ORDER — HYDROXYZINE HCL 25 MG PO TABS: 25.0000 mg | ORAL_TABLET | Freq: Three times a day (TID) | ORAL | Status: DC | PRN

## 2018-07-30 MED ORDER — CLONAZEPAM 0.5 MG PO TABS
0.5000 mg | ORAL_TABLET | Freq: Two times a day (BID) | ORAL | Status: AC
  Administered 2018-07-30 – 2018-08-01 (×4): 0.5 mg via ORAL
  Filled 2018-07-30 (×4): qty 1

## 2018-07-30 MED ORDER — ACETAMINOPHEN 325 MG PO TABS: 650.0000 mg | ORAL_TABLET | Freq: Four times a day (QID) | ORAL | Status: DC | PRN

## 2018-07-30 MED ORDER — MAGNESIUM HYDROXIDE 400 MG/5ML PO SUSP: 30.0000 mL | Freq: Every day | ORAL | Status: DC | PRN

## 2018-07-30 MED ORDER — BENZTROPINE MESYLATE 0.5 MG PO TABS
0.5000 mg | ORAL_TABLET | Freq: Two times a day (BID) | ORAL | Status: DC
  Administered 2018-07-30 – 2018-08-01 (×4): 0.5 mg via ORAL
  Filled 2018-07-30 (×10): qty 1

## 2018-07-30 MED ORDER — TRAZODONE HCL 150 MG PO TABS: 150.0000 mg | ORAL_TABLET | Freq: Every evening | ORAL | Status: DC | PRN

## 2018-07-30 MED ORDER — DIPHENHYDRAMINE HCL 25 MG PO CAPS: 25.0000 mg | ORAL_CAPSULE | Freq: Four times a day (QID) | ORAL | Status: DC | PRN

## 2018-07-30 MED ORDER — TEMAZEPAM 15 MG PO CAPS
30.0000 mg | ORAL_CAPSULE | Freq: Every day | ORAL | Status: DC
Start: 2018-07-30 — End: 2018-08-01

## 2018-07-30 MED ORDER — RISPERIDONE 3 MG PO TABS
3.0000 mg | ORAL_TABLET | Freq: Two times a day (BID) | ORAL | Status: DC
  Administered 2018-07-30 – 2018-08-01 (×4): 3 mg via ORAL
  Filled 2018-07-30 (×9): qty 1

## 2018-07-30 MED ORDER — OMEGA-3-ACID ETHYL ESTERS 1 G PO CAPS
1.0000 g | ORAL_CAPSULE | Freq: Two times a day (BID) | ORAL | Status: DC
  Administered 2018-07-30 – 2018-08-01 (×4): 1 g via ORAL
  Filled 2018-07-30 (×10): qty 1

## 2018-07-30 MED ORDER — PRENATAL MULTIVITAMIN CH
1.0000 | ORAL_TABLET | Freq: Every day | ORAL | Status: DC
  Administered 2018-07-30 – 2018-07-31 (×2): 1 via ORAL
  Filled 2018-07-30 (×4): qty 1

## 2018-07-30 MED ORDER — HALOPERIDOL LACTATE 5 MG/ML IJ SOLN: 5.0000 mg | Freq: Four times a day (QID) | INTRAMUSCULAR | Status: DC | PRN

## 2018-07-30 NOTE — Progress Notes (Signed)
Admission Note: Patient is a 28 years old male who is admitted to the unit for auditory hallucinations.  Patient is alert and oriented x 4.  Presents with flat affect and depressed mood.  Patient mother was present during admission.  Patient was minimal and only forward little information.  States he is here to work on getting rid of the voices and to stay focus.  Admission plan of care reviewed and consent signed.  Personal belongings and skin assessment completed.  Skin is dry and intact.  No contraband found.  Patient oriented to the unit, room and staff.  Verbalizes understanding of unit rules and protocols.  Routine safety checks initiated.  Patient is safe on the unit.

## 2018-07-30 NOTE — Progress Notes (Signed)
Patient did not attend wrap up group because he was asleep. 

## 2018-07-30 NOTE — H&P (Signed)
Behavioral Health Medical Screening Exam  James [name] is an 28 y.o. male patient presents to [facility] as walk in with complaints of auditory hallucinations that has been going on since November 2019 and has gradually gotten worse; not sleeping.  Mother at side and states that patient beating self in head to get voices to stop.  No prior psychiatric history.  No family psychiatric history.  Patient denies suicide/self-harm/homicidal ideation and paranoia  Total Time spent with patient: 30 minutes  Psychiatric Specialty Exam: Physical Exam  Vitals reviewed. Constitutional: He is oriented to person, place, and time. He appears well-developed and well-nourished.  Neck: Normal range of motion. Neck supple.  Respiratory: Effort normal.  Musculoskeletal: Normal range of motion.  Neurological: He is alert and oriented to person, place, and time.  Skin: Skin is warm and dry.  Psychiatric: His speech is normal. Thought content normal. His mood appears anxious. He is actively hallucinating. Cognition and memory are normal. He expresses impulsivity.    Review of Systems  Psychiatric/Behavioral: Positive for hallucinations and substance abuse (THC). The patient is nervous/anxious and has insomnia.   All other systems reviewed and are negative.   Blood pressure 139/84, pulse 87, temperature 98.1 F (36.7 C), resp. rate 18, SpO2 100 %.There is no height or weight on file to calculate BMI.  General Appearance: Casual  Eye Contact:  Good  Speech:  Clear and Coherent and Normal Rate  Volume:  Normal  Mood:  Anxious  Affect:  Depressed  Thought Process:  Coherent and Goal Directed  Orientation:  Full (Time, Place, and Person)  Thought Content:  Hallucinations: Auditory  Suicidal Thoughts:  No  Homicidal Thoughts:  No  Memory:  Immediate;   Fair Recent;   Fair Remote;   Fair  Judgement:  Fair  Insight:  Fair  Psychomotor Activity:  Decreased  Concentration: Concentration: Fair and Attention  Span: Fair  Recall:  [employer] of Knowledge:Fair  Language: Good  Akathisia:  No  Handed:  Right  AIMS (if indicated):     Assets:  Communication Skills Desire for Improvement Housing Social Support  Sleep:       Musculoskeletal: Strength & Muscle Tone: within normal limits Gait & Station: normal Patient leans: N/A  Blood pressure 139/84, pulse 87, temperature 98.1 F (36.7 C), resp. rate 18, SpO2 100 %.  Recommendations:  Inpatient psychiatric treatment   Based on my evaluation the patient does not appear to have an emergency medical condition.  Felise [place], NP 07/30/2018, 1:34 PM

## 2018-07-30 NOTE — BH Assessment (Signed)
Assessment Note  James [name] is an 28 y.o. male who [facility] mother, James [name] [phone], seeking help for his auditory hallucinations.  Patient states that he has been hearing voices in his head since October of last year.  He states that the voices are constant, they keep him awake at night and he states that they keep him from being able to concentrate.  His mother states that he is up most all night (patient states that he is only sleeping 4 hours per night and he states that he did not sleep any last night) and she states that she hears him talking to the voices and he hits himself in the head when he is hearing the voices.  Mother states that he also hears voices talking to him through the heating vents as well as through the radio in the car.  Patient states that a lot of the time that the voices are whispers and he is not able to hear what they are saying to him at times and sometimes he states that he hears static.  Patient's mother states that patient has struggled with paranoia in the past and felt like people were following him or looking through the windows of the house.  Patient states that he has never experienced any suicidal/homicidal ideation.  Patient's mother states that his erratic behavior with the voices is keeping her up at night and making it hard for her to go to work.  Patient has never had any mental health treatment on an inpatient or outpatient basis in the past. Patient denies any history of abuse or self-mutilation. Patient states that he has been smoking 1-2 blunts of marijuana daily, but states that his last use was last Friday.  Patient states that he completed high school and he went to [employer] for business and marketing for 2-3 years.  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 He states that he has never been married, but states that he has two young children ages 355 and 1.  Patient states  that he is currently not in a relationship.  Patient presented as oriented and alert.  His affect was flat and his mood was unremarkable.  Patient denied that he was depressed.  Patient's thoughts were mostly organized, but he was somewhat ambivalent in sharing about his psychosis.  His memory appeared to be intact.  His judgment, insight and impulse control were impaired.  His eye contact was good and his speech was clear.  His psychomotor activity was unremarkable.  He indicated that he was hearing voices while he was in the lobby prior to his assessment.  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  Past Surgical History:  Procedure Laterality Date  . HAND SURGERY      Family History:  Family History  Problem Relation Age of Onset  . Healthy Mother   . Healthy Father     Social History:  reports that he has quit smoking. His smoking use included cigarettes. He smoked 0.50 packs per day. He has never used smokeless tobacco. He reports current alcohol use. He reports current drug use. Drug: Marijuana.  Additional Social History:  Alcohol / Drug Use Pain Medications: See MAR Prescriptions: See MAR Over the Counter: See MAR History of alcohol / drug use?: Yes Longest period of sobriety (when/how long): no substantial period of clean time  Substance #1 Name of Substance 1: marijuana 1 - Age of First Use: 11 1 - Amount (size/oz): 1-2 blunts 1 - Frequency: daily 1 - Duration: since onset 1 - Last Use / Amount: last use was last Friday  CIWA: CIWA-Ar BP: 139/84 Pulse Rate: 87 COWS:    Allergies: No Known Allergies  Home Medications:  Medications Prior to Admission  Medication Sig Dispense Refill  . acetaminophen (TYLENOL) 325 MG tablet Take 650 mg by mouth every 6 (six) hours as needed for mild pain.    . fluticasone (FLONASE) 50 MCG/ACT nasal spray Place 1 spray into both nostrils daily. 16 g 2  .  HYDROcodone-acetaminophen (NORCO/VICODIN) 5-325 MG tablet Take 1 tablet by mouth every 6 (six) hours as needed for severe pain. 10 tablet 0  . ondansetron (ZOFRAN ODT) 4 MG disintegrating tablet Take 1 tablet (4 mg total) by mouth every 8 (eight) hours as needed for nausea or vomiting. 12 tablet 0  . oseltamivir (TAMIFLU) 75 MG capsule Take 1 capsule (75 mg total) by mouth every 12 (twelve) hours. 10 capsule 0  . Pseudoeph-CPM-DM-APAP (TYLENOL COLD PO) Take by mouth.    . traMADol (ULTRAM) 50 MG tablet Take 1-2 tablets (50-100 mg total) by mouth every 6 (six) hours as needed. (Patient taking differently: Take 100 mg by mouth every 6 (six) hours. ) 40 tablet 1    OB/GYN Status:  No LMP for male patient.  General Assessment Data Location of Assessment: [facility] Assessment Services TTS Assessment: In system Is this a Tele or Face-to-Face Assessment?: Face-to-Face Is this an Initial Assessment or a Re-assessment for this encounter?: Initial Assessment Patient Accompanied by:: Parent Language Other than English: No Living Arrangements: Other (Comment)(lives with mother) What gender do you identify as?: Male Marital status: Single Living Arrangements: Parent Can pt return to current living arrangement?: Yes Admission Status: Voluntary Is patient capable of signing voluntary admission?: Yes Referral Source: Self/Family/Friend Insurance type: Biomedical scientist)     Crisis Care Plan Living Arrangements: Parent Legal Guardian: Other:(self) Name of Psychiatrist: none Name of Therapist: none  Education Status Is patient currently in school?: No Is the patient employed, unemployed or receiving disability?: Unemployed  Risk to self with the past 6 months Suicidal Ideation: No Has patient been a risk to self within the past 6 months prior to admission? : No Suicidal Intent: No Has patient had any suicidal intent within the past 6 months prior to admission? : No Is patient at risk for suicide?:  No Suicidal Plan?: No Has patient had any suicidal plan within the past 6 months prior to admission? : No Access to Means: No What has been your use of drugs/alcohol within the last 12 months?: daily THC use Previous Attempts/Gestures: No How many times?: 0 Other Self Harm Risks: (none) Triggers for Past Attempts: None known Intentional Self Injurious Behavior: None Family Suicide History: No Recent stressful life event(s): Other (Comment)(none reported) Persecutory voices/beliefs?: No Depression: Yes Depression Symptoms: Insomnia, Loss of interest in usual pleasures, Feeling worthless/self pity Substance abuse history and/or treatment for substance abuse?: No Suicide prevention information given to non-admitted patients: Not applicable  Risk to Others within the past 6 months Homicidal Ideation: No Does patient have any lifetime risk of violence toward others beyond the six months prior to admission? : No Thoughts of Harm to Others: No Current Homicidal Intent: No Current Homicidal Plan: No Access to Homicidal Means: No Identified Victim: none History of harm to others?: No Assessment of Violence: None  Noted Violent Behavior Description: none Does patient have access to weapons?: No Criminal Charges Pending?: No Does patient have a court date: No Is patient on probation?: No  Psychosis Hallucinations: Auditory Delusions: None noted  Mental Status Report Appearance/Hygiene: Unremarkable Eye Contact: Good Motor Activity: Freedom of movement Speech: Logical/coherent Level of Consciousness: Alert Mood: Apprehensive Affect: Flat Anxiety Level: Minimal Thought Processes: Coherent, Relevant Judgement: Impaired Orientation: Person, Place, Time, Situation Obsessive Compulsive Thoughts/Behaviors: None  Cognitive Functioning Concentration: Decreased Memory: Recent Intact, Remote Intact Is patient IDD: No Insight: Poor Impulse Control: Poor Appetite: Good Have you had  any weight changes? : No Change Sleep: Decreased Total Hours of Sleep: 4 Vegetative Symptoms: None  ADLScreening [facility] Assessment Services) Patient's cognitive ability adequate to safely complete daily activities?: Yes Patient able to express need for assistance with ADLs?: Yes Independently performs ADLs?: Yes (appropriate for developmental age)  Prior Inpatient Therapy Prior Inpatient Therapy: No  Prior Outpatient Therapy Prior Outpatient Therapy: No Does patient have an ACCT team?: No Does patient have Intensive In-House Services?  : No Does patient have [facility] services? : No Does patient have P4CC services?: No  ADL Screening (condition at time of admission) Patient's cognitive ability adequate to safely complete daily activities?: Yes Is the patient deaf or have difficulty hearing?: No Does the patient have difficulty seeing, even when wearing glasses/contacts?: No Does the patient have difficulty concentrating, remembering, or making decisions?: No Patient able to express need for assistance with ADLs?: Yes Does the patient have difficulty dressing or bathing?: No Independently performs ADLs?: Yes (appropriate for developmental age) Does the patient have difficulty walking or climbing stairs?: No Weakness of Legs: None Weakness of Arms/Hands: None  Home Assistive Devices/Equipment Home Assistive Devices/Equipment: None  Therapy Consults (therapy consults require a physician order) PT Evaluation Needed: No OT Evalulation Needed: No SLP Evaluation Needed: No Abuse/Neglect Assessment (Assessment to be complete while patient is alone) Abuse/Neglect Assessment Can Be Completed: Yes Physical Abuse: Denies Verbal Abuse: Denies Sexual Abuse: Denies Exploitation of patient/patient's resources: Denies Self-Neglect: Denies Values / Beliefs Cultural Requests During Hospitalization: None Spiritual Requests During Hospitalization: None Consults Spiritual Care Consult Needed:  No Social Work Consult Needed: No Merchant navy officer (For Healthcare) Does Patient Have a Medical Advance Directive?: No Would patient like information on creating a medical advance directive?: No - Patient declined Nutrition Screen- [facility] Adult/[facility]/AP Has the patient recently lost weight without trying?: No Has the patient been eating poorly because of a decreased appetite?: No Malnutrition Screening Tool Score: 0        Disposition: Per Shuvon Rankin, NP, patient meets inpatient admission criteria and has been accepted to [id number]. Disposition Initial Assessment Completed for this Encounter: Yes Disposition of Patient: Admit Type of inpatient treatment program: Adult  On Site Evaluation by:   Reviewed with Physician:    Arnoldo Lenis Satina Jerrell 07/30/2018 2:01 PM

## 2018-07-30 NOTE — Tx Team (Signed)
Initial Treatment Plan 07/30/2018 3:20 PM James [name] PXT:[id number]    PATIENT STRESSORS: Financial difficulties Health problems   PATIENT STRENGTHS: Average or above average intelligence Capable of independent living Communication skills   PATIENT IDENTIFIED PROBLEMS: "stay focus"  "getting rid of the voices"  Depression  Hallucinations               DISCHARGE CRITERIA:  Ability to meet basic life and health needs Adequate post-discharge living arrangements Motivation to continue treatment in a less acute level of care  PRELIMINARY DISCHARGE PLAN: Attend aftercare/continuing care group Outpatient therapy Return to previous living arrangement  PATIENT/FAMILY INVOLVEMENT: This treatment plan has been presented to and reviewed with the patient, James [name], and/or family member.  The patient and family have been given the opportunity to ask questions and make suggestions.  James Lia [name], RN 07/30/2018, 3:20 PM
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
## 2018-07-30 NOTE — Progress Notes (Signed)
D: Pt been sleep majority of the evening A:  Q 15 minute checks were done for safety.  R:  safety maintained on unit.

## 2018-07-31 LAB — COMPREHENSIVE METABOLIC PANEL
ALT: 13 U/L (ref 0–44)
AST: 15 U/L (ref 15–41)
Albumin: 4 g/dL (ref 3.5–5.0)
Alkaline Phosphatase: 55 U/L (ref 38–126)
Anion gap: 10 (ref 5–15)
BUN: 16 mg/dL (ref 6–20)
CO2: 23 mmol/L (ref 22–32)
Calcium: 9.3 mg/dL (ref 8.9–10.3)
Chloride: 109 mmol/L (ref 98–111)
Creatinine, Ser: 1.24 mg/dL (ref 0.61–1.24)
GFR calc Af Amer: >60 mL/min (ref >60)
GFR calc non Af Amer: >60 mL/min (ref >60)
Glucose, Bld: 92 mg/dL (ref 70–99)
Potassium: 4 mmol/L (ref 3.5–5.1)
Sodium: 142 mmol/L (ref 135–145)
Total Bilirubin: 0.4 mg/dL (ref 0.3–1.2)
Total Protein: 7.6 g/dL (ref 6.5–8.1)

## 2018-07-31 LAB — HEMOGLOBIN A1C
Hgb A1c MFr Bld: 5.3 % (ref 4.8–5.6)
Mean Plasma Glucose: 105.41 mg/dL

## 2018-07-31 LAB — CBC
HCT: 47.7 % (ref 39.0–52.0)
Hemoglobin: 15.1 g/dL (ref 13.0–17.0)
MCH: 28.2 pg (ref 26.0–34.0)
MCHC: 31.7 g/dL (ref 30.0–36.0)
MCV: 89.2 fL (ref 80.0–100.0)
Platelets: 279 10*3/uL (ref 150–400)
RBC: 5.35 MIL/uL (ref 4.22–5.81)
RDW: 13.3 % (ref 11.5–15.5)
WBC: 4.7 10*3/uL (ref 4.0–10.5)
nRBC: 0 % (ref 0.0–0.2)

## 2018-07-31 LAB — LIPID PANEL
Cholesterol: 186 mg/dL (ref 0–200)
HDL: 40 mg/dL — ABNORMAL LOW (ref >40)
LDL Cholesterol: 130 mg/dL — ABNORMAL HIGH (ref 0–99)
Total CHOL/HDL Ratio: 4.7 RATIO
Triglycerides: 79 mg/dL (ref <150)
VLDL: 16 mg/dL (ref 0–40)

## 2018-07-31 LAB — ETHANOL: Alcohol, Ethyl (B): <10 mg/dL (ref <10)

## 2018-07-31 LAB — TSH: TSH: 2.91 u[IU]/mL (ref 0.350–4.500)

## 2018-07-31 NOTE — Progress Notes (Signed)
Recreation Therapy Notes  Date: 4.8.20 Time: 1000 Location: 500 Hall Dayroom  Group Topic: Anxiety  Goal Area(s) Addresses:  Patient will identify triggers for anxiety. Patient will identify physical symptoms they have when anxious. Patient will identify coping skills for anxiety.  Intervention:  Worksheet  Activity: Intro to Anxiety.  Patients were to identify the things that trigger their anxiety, physical symptoms they have when anxious, thoughts they have when anxious and ways they cope with anxiety.  Education: Communication, Discharge Planning  Education Outcome: Acknowledges understanding/In group clarification offered/Needs additional education.   Clinical Observations/Feedback: Pt did not attend group.    Jhamal Plucinski, LRT/CTRS        James [name] A 07/31/2018 11:17 AM 

## 2018-07-31 NOTE — [facility] Group Notes (Signed)
  [facility] LCSW Group Therapy Note  Date/Time: 07/31/18, 1315  Type of Therapy/Topic:  Group Therapy:  Emotion Regulation  Participation Level:  Active   Mood:pleasant  Description of Group:    The purpose of this group is to assist patients in learning to regulate negative emotions and experience positive emotions. Patients will be guided to discuss ways in which they have been vulnerable to their negative emotions. These vulnerabilities will be juxtaposed with experiences of positive emotions or situations, and patients challenged to use positive emotions to combat negative ones. Special emphasis will be placed on coping with negative emotions in conflict situations, and patients will process healthy conflict resolution skills.  Therapeutic Goals: 1. Patient will identify two positive emotions or experiences to reflect on in order to balance out negative emotions:  2. Patient will label two or more emotions that they find the most difficult to experience:  3. Patient will be able to demonstrate positive conflict resolution skills through discussion or role plays:   Summary of Patient Progress:Pt was engaged throughout and made several comments during group discussion.  Pt said that anxiety was an emotions that is difficult for him to experience and participated in the discussion about positive ways to handle negative emotions.        Therapeutic Modalities:   Cognitive Behavioral Therapy Feelings Identification Dialectical Behavioral Therapy  Daleen Squibb, LCSW

## 2018-07-31 NOTE — Progress Notes (Signed)
Recreation Therapy Notes  INPATIENT RECREATION THERAPY ASSESSMENT  Patient Details Name: James [name] MRN: [id number] DOB: 1991-01-14 Today's Date: 07/31/2018       Information Obtained From: Patient  Able to Participate in Assessment/Interview: Yes  Patient Presentation: Alert  Reason for Admission (Per Patient): Other (Comments)(Hearing voices)  Patient Stressors: Other (Comment)(Lack of sleep; staying focused)  Coping Skills:   Sports, TV, Music, Meditate, Deep Breathing, Talk, Prayer, Avoidance, Read, Hot Bath/Shower  Leisure Interests (2+):  Individual - TV, Music - Listen, Social - Family(Take kids to the park)  Frequency of Recreation/Participation: Weekly  Awareness of Community Resources:  Yes  Community Resources:  Park, Other (Comment)(Science center, zoo)  Current Use: Yes  If no, Barriers?:    Expressed Interest in [employer] Information: No  [employer] of Residence:  [place]  Patient Main Form of Transportation: Set designer  Patient Strengths:  Work everyday; Take care of kids  Patient Identified Areas of Improvement:  Listening; Stop drinking  Patient Goal for Hospitalization:  "to get sleep and get rid of voices"  Current SI (including self-harm):  No  Current HI:  No  Current AVH: No  Staff Intervention Plan: Group Attendance, Collaborate with Interdisciplinary Treatment Team  Consent to Intern Participation: N/A     Caroll Rancher, LRT/CTRS  Caroll Rancher A 07/31/2018, 12:54 PM

## 2018-07-31 NOTE — Progress Notes (Signed)
Pt did not attend goals/orientation group this morning.  

## 2018-07-31 NOTE — [facility] Suicide Risk Assessment (Signed)
[facility] INPATIENT:  Family/Significant Other Suicide Prevention Education  Suicide Prevention Education:  Education Completed;  Gala Murdoch, mother, [phone], has been identified by the patient as the family member/significant other with whom the patient will be residing, and identified as the person(s) who will aid the patient in the event of a mental health crisis (suicidal ideations/suicide attempt).  With written consent from the patient, the family member/significant other has been provided the following suicide prevention education, prior to the and/or following the discharge of the patient.  The suicide prevention education provided includes the following:  Suicide risk factors  Suicide prevention and interventions  National Suicide Hotline telephone number  [facility] assessment telephone number  [facility] Emergency Assistance 911  [facility] and/or Residential Mobile Crisis Unit telephone number  Request made of family/significant other to:  Remove weapons (e.g., guns, rifles, knives), all items previously/currently identified as safety concern.   No guns, per mother.    Remove drugs/medications (over-the-counter, prescriptions, illicit drugs), all items previously/currently identified as a safety concern.  The family member/significant other verbalizes understanding of the suicide prevention education information provided.  The family member/significant other agrees to remove the items of safety concern listed above.  Mother reports the hallucinations have been going on since the fall.  Pt has also not been sleeping for several months and has been up and going outside in the middle of the night.  Pt has been able to maintain his job, but reports the voices are present at work too.  No prior history, per mom.  She spoke to him today--said he has been talking very quietly for the past while and today he was talking normally.  Lorri Frederick,  LCSW 07/31/2018, 3:35 PM

## 2018-07-31 NOTE — Plan of Care (Signed)
Progress note  D: pt found in bed; compliant with medication administration. Pt denies any physical symptoms or pain, rating this a 0/10. Pt states he slept well. Pt has been isolative to his room but pleasant. Pt denies si/hi/ah/vh and verbally agrees to approach staff if these become apparent or before harming himself/others while at [facility].  A: pt provided support and encouragement. Pt given medication per protocol and standing orders. Q13m safety checks implemented and continued.  R: pt safe on the unit. Will continue to monitor.   Pt progressing in the following metrics  Problem: Education: Goal: Knowledge of  General Education information/materials will improve Outcome: Progressing Goal: Emotional status will improve Outcome: Progressing Goal: Mental status will improve Outcome: Progressing Goal: Verbalization of understanding the information provided will improve Outcome: Progressing

## 2018-07-31 NOTE — Progress Notes (Signed)
D   Pt is in bed sleeping and did not wake when his name was called   He has a tendency to isolate in his room   He is cooperative with most aspects of his treatment  A   Verbal support given    Medications offered   Q 15 min checks R   Pt remains safe at this time

## 2018-07-31 NOTE — Progress Notes (Signed)
[facility]  07/31/2018 9:28 AM Paulette BlanchLetre E Kluger  MRN:  [id number] Subjective:    Generally stable in mood he reports no current auditory or visual hallucinations believes that the auditory hallucinations have resolved with medication he is sleeping better. Still a bit garbled in speech and sluggish but this is the way he presented premedication. 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 No pertinent past medical history.  Past Surgical History:  Procedure Laterality Date  . HAND SURGERY     Family History:  Family History  Problem Relation Age of Onset  . Healthy Mother   . Healthy Father    Family Psychiatric  History: neg Social History:  Social History   Substance and Sexual Activity  Alcohol Use Yes   Comment: every day     Social History   Substance and Sexual Activity  Drug Use Yes  . Types: Marijuana    Social History   Socioeconomic History  . Marital status: Single    Spouse name: Not on file  . Number of children: Not on file  . Years of education: Not on file  . Highest education level: Not on file  Occupational History  . Not on file  Social Needs  . Financial resource strain: Not on file  . Food insecurity:    Worry: Not on file    Inability: Not on file  . Transportation needs:    Medical: Not on file    Non-medical: Not on file  Tobacco Use  . Smoking status: Former Smoker    Packs/day: 0.50    Types: Cigarettes  . Smokeless tobacco: Never Used  Substance and Sexual Activity  . Alcohol use: Yes    Comment: every day  . Drug use: Yes    Types: Marijuana  . Sexual activity: Not on file  Lifestyle  . Physical  activity:    Days per week: Not on file    Minutes per session: Not on file  . Stress: Not on file  Relationships  . Social connections:    Talks on phone: Not on file    Gets together: Not on file    Attends religious service: Not on file    Active member of club or organization: Not on file    Attends meetings of clubs or organizations: Not on file    Relationship status: Not on file  Other Topics Concern  . Not on file  Social History Narrative  . Not on file   Additional Social History:    Pain Medications: See MAR Prescriptions: See MAR Over the Counter: See MAR History of alcohol / drug use?: Yes Longest period of sobriety (when/how long): no substantial period of clean time Name of Substance 1: marijuana 1 - Age of First Use: 11 1 - Amount (size/oz): 1-2 blunts 1 - Frequency: daily 1 - Duration: since onset 1 - Last Use / Amount: last use was last Friday                  Sleep: Good  Appetite:  Good  Current Medications: Current Facility-Administered Medications  Medication Dose Route Frequency Provider Last Rate Last Dose  . acetaminophen (TYLENOL) tablet 650 mg  650 mg  Oral Q6H PRN Malvin Johns, MD      . alum & mag hydroxide-simeth (MAALOX/MYLANTA) 200-200-20 MG/5ML suspension 30 mL  30 mL Oral Q4H PRN Malvin Johns, MD      . benztropine (COGENTIN) tablet 0.5 mg  0.5 mg Oral BID Malvin Johns, MD   0.5 mg at 07/31/18 0749  . clonazePAM (KLONOPIN) tablet 0.5 mg  0.5 mg Oral BID Malvin Johns, MD   0.5 mg at 07/31/18 0749  . diphenhydrAMINE (BENADRYL) capsule 25 mg  25 mg Oral Q6H PRN Malvin Johns, MD      . haloperidol (HALDOL) tablet 5 mg  5 mg Oral Q6H PRN Malvin Johns, MD       Or  . haloperidol lactate (HALDOL) injection 5 mg  5 mg Intramuscular Q6H PRN Malvin Johns, MD      . hydrOXYzine (ATARAX/VISTARIL) tablet 25 mg  25 mg Oral TID PRN Malvin Johns, MD      . magnesium hydroxide (MILK OF MAGNESIA) suspension 30 mL  30 mL Oral Daily PRN Malvin Johns, MD      . omega-3 acid ethyl esters (LOVAZA) capsule 1 g  1 g Oral BID Malvin Johns, MD   1 g at 07/31/18 0749  . prenatal multivitamin tablet 1 tablet  1 tablet Oral Q1200 Malvin Johns, MD   1 tablet at 07/30/18 1700  . risperiDONE (RISPERDAL) tablet 3 mg  3 mg Oral BID Malvin Johns, MD   3 mg at 07/31/18 0749  . temazepam (RESTORIL) capsule 30 mg  30 mg Oral QHS Malvin Johns, MD      . traZODone (DESYREL) tablet 150 mg  150 mg Oral QHS PRN Malvin Johns, MD        Lab Results:  Results for orders placed or performed during the hospital encounter of 07/30/18 (from the past 48 hour(s))  Urinalysis, Complete w Microscopic     Status: Abnormal   Collection Time: 07/30/18  3:52 PM  Result Value Ref Range   Color, Urine YELLOW YELLOW   APPearance CLEAR CLEAR   Specific Gravity, Urine 1.021 1.005 - 1.030   pH 7.0 5.0 - 8.0   Glucose, UA NEGATIVE NEGATIVE mg/dL   Hgb urine dipstick NEGATIVE NEGATIVE   Bilirubin Urine NEGATIVE NEGATIVE   Ketones, ur NEGATIVE NEGATIVE mg/dL   Protein, ur NEGATIVE NEGATIVE mg/dL   Nitrite NEGATIVE NEGATIVE   Leukocytes,Ua TRACE (A) NEGATIVE   RBC / HPF 0-5 0 - 5 RBC/hpf   WBC, UA 0-5 0 - 5 WBC/hpf   Bacteria, UA NONE SEEN NONE SEEN   Mucus PRESENT    Ca Oxalate Crys, UA PRESENT     Comment: Performed at [facility], [address] [place], [place] [address]  Urine rapid drug screen (hosp performed)not at [facility]     Status: Abnormal   Collection Time: 07/30/18  3:52 PM  Result Value Ref Range   Opiates NONE DETECTED NONE DETECTED   Cocaine NONE DETECTED NONE DETECTED   Benzodiazepines NONE DETECTED NONE DETECTED   Amphetamines POSITIVE (A) NONE DETECTED   Tetrahydrocannabinol NONE DETECTED NONE DETECTED   Barbiturates NONE DETECTED NONE DETECTED    Comment: (NOTE) DRUG SCREEN FOR MEDICAL PURPOSES ONLY.  IF CONFIRMATION IS NEEDED FOR ANY PURPOSE, NOTIFY LAB WITHIN 5 DAYS. LOWEST DETECTABLE LIMITS FOR URINE DRUG SCREEN Drug  Class                     Cutoff (ng/mL) Amphetamine and metabolites  1000 Barbiturate and metabolites    200 Benzodiazepine                 200 Tricyclics and metabolites     300 Opiates and metabolites        300 Cocaine and metabolites        300 THC                            50 Performed at [facility], [address], [place], [place] [address]   CBC     Status: None   Collection Time: 07/31/18  6:55 AM  Result Value Ref Range   WBC 4.7 4.0 - 10.5 K/uL   RBC 5.35 4.22 - 5.81 MIL/uL   Hemoglobin 15.1 13.0 - 17.0 g/dL   HCT 14.4 31.5 - 40.0 %   MCV 89.2 80.0 - 100.0 fL   MCH 28.2 26.0 - 34.0 pg   MCHC 31.7 30.0 - 36.0 g/dL   RDW 86.7 61.9 - 50.9 %   Platelets 279 150 - 400 K/uL   nRBC 0.0 0.0 - 0.2 %    Comment: Performed at [facility], [address], [place], [place] [address]  Comprehensive metabolic panel     Status: None   Collection Time: 07/31/18  6:55 AM  Result Value Ref Range   Sodium 142 135 - 145 mmol/L   Potassium 4.0 3.5 - 5.1 mmol/L   Chloride 109 98 - 111 mmol/L   CO2 23 22 - 32 mmol/L   Glucose, Bld 92 70 - 99 mg/dL   BUN 16 6 - 20 mg/dL   Creatinine, Ser 2.45 0.61 - 1.24 mg/dL   Calcium 9.3 8.9 - 80.9 mg/dL   Total Protein 7.6 6.5 - 8.1 g/dL   Albumin 4.0 3.5 - 5.0 g/dL   AST 15 15 - 41 U/L   ALT 13 0 - 44 U/L   Alkaline Phosphatase 55 38 - 126 U/L   Total Bilirubin 0.4 0.3 - 1.2 mg/dL   GFR calc non Af Amer >60 >60 mL/min   GFR calc Af Amer >60 >60 mL/min   Anion gap 10 5 - 15    Comment: Performed at [facility], [address], [place], [place] [address]  Ethanol     Status: None   Collection Time: 07/31/18  6:55 AM  Result Value Ref Range   Alcohol, Ethyl (B) <10 <10 mg/dL    Comment: (NOTE) Lowest detectable limit for serum alcohol is 10 mg/dL. For medical purposes only. Performed at [facility], [address] [place], [place] [address]   Lipid panel      Status: Abnormal   Collection Time: 07/31/18  6:55 AM  Result Value Ref Range   Cholesterol 186 0 - 200 mg/dL   Triglycerides 79 <976 mg/dL   HDL 40 (L) >73 mg/dL   Total CHOL/HDL Ratio 4.7 RATIO   VLDL 16 0 - 40 mg/dL   LDL Cholesterol 419 (H) 0 - 99 mg/dL    Comment:        Total Cholesterol/HDL:CHD Risk Coronary Heart Disease Risk Table                     Men   Women  1/2 Average Risk   3.4   3.3  Average Risk       5.0   4.4  2 X  Average Risk   9.6   7.1  3 X Average Risk  23.4   11.0        Use the calculated Patient Ratio above and the CHD Risk Table to determine the patient's CHD Risk.        ATP III CLASSIFICATION (LDL):  <100     mg/dL   Optimal  295-284  mg/dL   Near or Above                    Optimal  130-159  mg/dL   Borderline  132-440  mg/dL   High  >102     mg/dL   Very High Performed at [facility], [address] [place], [place] [address]   TSH     Status: None   Collection Time: 07/31/18  6:55 AM  Result Value Ref Range   TSH 2.910 0.350 - 4.500 uIU/mL    Comment: Performed by a 3rd Generation assay with a functional sensitivity of <=0.01 uIU/mL. Performed at [facility], [address] [place], [place] [address]   Hemoglobin A1c     Status: None   Collection Time: 07/31/18  6:56 AM  Result Value Ref Range   Hgb A1c MFr Bld 5.3 4.8 - 5.6 %    Comment: (NOTE) Pre diabetes:          5.7%-6.4% Diabetes:              >6.4% Glycemic control for   <7.0% adults with diabetes    Mean Plasma Glucose 105.41 mg/dL    Comment: Performed at [facility], [address], [place], [place] [address]    Blood Alcohol level:  Lab Results  Component Value Date   ETH <10 07/31/2018   ETH 293 (H) 09/16/2017    Metabolic Disorder Labs: Lab Results  Component Value Date   HGBA1C 5.3 07/31/2018   MPG 105.41 07/31/2018   No results found for: PROLACTIN Lab Results  Component Value Date   CHOL 186  07/31/2018   TRIG 79 07/31/2018   HDL 40 (L) 07/31/2018   CHOLHDL 4.7 07/31/2018   VLDL 16 07/31/2018   LDLCALC 130 (H) 07/31/2018    Physical Findings: AIMS:  , ,  ,  ,    CIWA:    COWS:     Musculoskeletal: Strength & Muscle Tone: within normal limits Gait & Station: normal Patient leans: N/A  Psychiatric Specialty Exam: Physical Exam  ROS  Blood pressure 139/84, pulse 87, temperature 98.1 F (36.7 C), temperature source Oral, resp. rate 18, height  (1.651 m), weight 81.6 kg, SpO2 100 %.Body mass index is 29.95 kg/m.  General Appearance: Casual  Eye Contact:  Good  Speech:  Garbled and Slow  Volume:  Decreased  Mood:  Dysphoric  Affect:  Blunt and Constricted  Thought Process:  Goal Directed  Orientation:  Full (Time, Place, and Person)  Thought Content:  Tangential  Suicidal Thoughts:  No  Homicidal Thoughts:  No  Memory:  Immediate;   Fair  Judgement:  Fair  Insight:  Fair  Psychomotor Activity:  Decreased  Concentration:  Concentration: Fair  Recall:  [employer] of Knowledge:  Fair  Language:  Fair  Akathisia:  Negative  Handed:  Right  AIMS (if indicated):     Assets:  Physical Health Resilience  ADL's:  Intact  Cognition:  WNL  Sleep:  Number of Hours: 9.5  Treatment Plan Summary: Daily contact with patient to assess and evaluate symptoms and progress in treatment, Medication management and Plan Continue current meds and precautions may lower Risperdal eventually but at this point meds are working  [facility], MD 07/31/2018, 9:28 AM

## 2018-07-31 NOTE — BHH Counselor (Signed)
Adult Comprehensive Assessment  Patient ID: James [name], male   DOB: [birth year], 28 y.o.   MRN: [id number]  Information Source: Information source: Patient  Current Stressors:  Patient states their primary concerns and needs for treatment are:: voices stop, sleep better Patient states their goals for this hospitilization and ongoing recovery are:: voices to stop Educational / Learning stressors: Pt reports only stressor is that the voices won't go away  Living/Environment/Situation:  Living Arrangements: Parent Living conditions (as described by patient or guardian): goes good Who else lives in the home?: mother, brother How long has patient lived in current situation?: 6 months What is atmosphere in current home: Comfortable, Supportive  Family History:  Marital status: Single Are you sexually active?: Yes What is your sexual orientation?: heterosexual Has your sexual activity been affected by drugs, alcohol, medication, or emotional stress?: no Does patient have children?: Yes How many children?: 2 How is patient's relationship with their children?: two daughters, age 73 and 1.  Live with their mother, good relationship, keeps them every weekend.  Childhood History:  By whom was/is the patient raised?: Both parents Additional childhood history information: Parents split with up when pt was [age], pt lived with both parents at idfferent times. Pt reports he had a good childhood.   Description of patient's relationship with caregiver when they were a child: mom: great, dad: great Patient's description of current relationship with people who raised him/her: mom: great, dad: father died 20-Aug-2015 How were you disciplined when you got in trouble as a child/adolescent?: appropriate discipline Does patient have siblings?: Yes Number of Siblings: 6 Description of patient's current relationship with siblings: 3 brothers, 3 sisters: youngest brother still at home with.  Good  relationships Did patient suffer any verbal/emotional/physical/sexual abuse as a child?: No Did patient suffer from severe childhood neglect?: No Has patient ever been sexually abused/assaulted/raped as an adolescent or adult?: No Was the patient ever a victim of a crime or a disaster?: No Witnessed domestic violence?: No Has patient been effected by domestic violence as an adult?: No  Education:  Highest grade of school patient has completed: HS diploma, 3 years college Currently a Consulting civil engineer?: No Learning disability?: No  Employment/Work Situation:   Employment situation: Employed Where is patient currently employed?: [employer] How long has patient been employed?: 7 years Patient's job has been impacted by current illness: No What is the longest time patient has a held a job?: current job Did You Receive Any Psychiatric Treatment/Services While in Equities trader?: No Are There Guns or Other Weapons in Your Home?: No  Financial Resources:   Financial resources: Income from employment, Media planner, Support from parents / caregiver Does patient have a Lawyer or guardian?: No  Alcohol/Substance Abuse:   What has been your use of drugs/alcohol within the last 12 months?: alcohol: pt denies, Marijuana: 1x month.  Pt not sure where amphetamine positive in UDS came from.   If attempted suicide, did drugs/alcohol play a role in this?: No Alcohol/Substance Abuse Treatment Hx: Past Tx, Outpatient(court ordered) Has alcohol/substance abuse ever caused legal problems?: No  Social Support System:   Patient's Community Support System: Good Describe Community Support System: mom, kid's mother, daughters, extended family and friends Type of faith/religion: none How does patient's faith help to cope with current illness?: na  Leisure/Recreation:   Leisure and Hobbies: take my kids out/spend time with kids  Strengths/Needs:   What is the patient's perception of their strengths?:  staying out of trouble, hard  worker, responsible Patient states they can use these personal strengths during their treatment to contribute to their recovery: keep going to work Patient states these barriers may affect/interfere with their treatment: none Patient states these barriers may affect their return to the community: none Other important information patient would like considered in planning for their treatment: none  Discharge Plan:   Currently receiving community mental health services: No Patient states concerns and preferences for aftercare planning are: PT willing to see Dr [name] Patient states they will know when they are safe and ready for discharge when: I feel like I'm ready to go now.  I slept Does patient have access to transportation?: Yes Does patient have financial barriers related to discharge medications?: No Will patient be returning to same living situation after discharge?: Yes  Summary/Recommendations:   Summary and Recommendations (to be completed by the evaluator): Pt is 28 year old [place]  Pt is diagnosed with schizophreniform disorder and was admitted due to auditory hallucinations.  Recommendations for pt include crisis stabilization, therapeutic milieu, attend and participate in groups, medication management, and development of comprehensive mental wellness plan.    James [name], James [name]. 07/31/2018

## 2018-07-31 NOTE — Tx Team (Signed)
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 acetaminophen (TYLENOL) tablet 650 mg  650 mg Oral Q6H PRN Johnn Hai, MD      . alum & mag hydroxide-simeth (MAALOX/MYLANTA) 200-200-20 MG/5ML suspension 30 mL  30 mL Oral Q4H PRN Johnn Hai, MD      . benztropine (COGENTIN) tablet 0.5 mg  0.5 mg Oral BID Johnn Hai, MD   0.5 mg at 07/31/18 0749  . clonazePAM (KLONOPIN) tablet 0.5 mg  0.5 mg Oral BID Johnn Hai, MD   0.5 mg at 07/31/18 0749  . diphenhydrAMINE (BENADRYL) capsule 25 mg  25 mg Oral Q6H PRN Johnn Hai, MD      . haloperidol (HALDOL) tablet 5 mg  5 mg Oral Q6H PRN Johnn Hai, MD       Or  . haloperidol lactate (HALDOL) injection 5 mg  5 mg Intramuscular Q6H PRN Johnn Hai, MD      . hydrOXYzine (ATARAX/VISTARIL) tablet 25 mg  25 mg Oral TID PRN Johnn Hai, MD      . magnesium hydroxide (MILK OF MAGNESIA) suspension 30 mL  30 mL Oral Daily PRN Johnn Hai, MD      . omega-3 acid ethyl esters (LOVAZA) capsule 1 g  1 g Oral BID Johnn Hai, MD   1 g at 07/31/18 0749  . prenatal multivitamin tablet 1 tablet  1 tablet Oral Q1200 Johnn Hai, MD   1 tablet at 07/30/18 1700  . risperiDONE (RISPERDAL) tablet 3 mg  3 mg Oral BID Johnn Hai, MD   3 mg at 07/31/18 0749  . temazepam (RESTORIL) capsule 30 mg  30 mg Oral QHS Johnn Hai, MD      . traZODone (DESYREL) tablet 150 mg  150 mg Oral QHS PRN Johnn Hai, MD       PTA Medications: Medications Prior to Admission  Medication Sig Dispense Refill Last Dose   . fluticasone (FLONASE) 50 MCG/ACT nasal spray Place 1 spray into both nostrils daily. (Patient not taking: Reported on 07/30/2018) 16 g 2 Not Taking at Unknown time  . HYDROcodone-acetaminophen (NORCO/VICODIN) 5-325 MG tablet Take 1 tablet by mouth every 6 (six) hours as needed for severe pain. (Patient not taking: Reported on 07/30/2018) 10 tablet 0 Not Taking at Unknown time  . ondansetron (ZOFRAN ODT) 4 MG disintegrating tablet Take 1 tablet (4 mg total) by mouth every 8 (eight) hours as needed for nausea or vomiting. (Patient not taking: Reported on 07/30/2018) 12 tablet 0 Completed Course at Unknown time  . oseltamivir (TAMIFLU) 75 MG capsule Take 1 capsule (75 mg total) by mouth every 12 (twelve) hours. (Patient not taking: Reported on 07/30/2018) 10 capsule 0 Completed Course at Unknown time  . traMADol (ULTRAM) 50 MG tablet Take 1-2 tablets (50-100 mg total) by mouth every 6 (six) hours as needed. (Patient not taking: Reported on 07/30/2018) 40 tablet 1 Completed Course at Unknown time    Patient Stressors: Financial difficulties Health problems  Patient Strengths: Average or above average intelligence Capable of independent living Communication skills  Treatment Modalities: Medication Management, Group therapy, Case management,  1 to 1 session  with clinician, Psychoeducation, Recreational therapy.   Physician Treatment Plan for Primary Diagnosis: <principal problem not specified> Long Term Goal(s): Improvement in symptoms so as ready for discharge Improvement in symptoms so as ready for discharge   Short Term Goals: Ability to identify and develop effective coping behaviors will improve Ability to identify and develop effective coping behaviors will improve  Medication Management: Evaluate patient's response, side effects, and tolerance of medication regimen.  Therapeutic Interventions: 1 to 1 sessions, Unit Group sessions and Medication administration.  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  Therapeutic Interventions: 1 to 1 sessions, Unit Group sessions and Medication administration.  Evaluation of Outcomes: Not Met   RN Treatment Plan for Primary Diagnosis: <principal problem not specified> Long Term Goal(s): Knowledge of disease and therapeutic regimen to maintain health will improve  Short Term Goals: Ability to identify and develop effective coping behaviors will improve and Compliance with prescribed medications will improve  Medication Management: RN will administer medications as ordered by provider, will assess and evaluate patient's response and provide education to patient for prescribed medication. RN will report any adverse and/or side effects to prescribing provider.  Therapeutic Interventions: 1 on 1 counseling sessions, Psychoeducation, Medication administration, Evaluate responses to treatment, Monitor vital signs and CBGs as ordered, Perform/monitor CIWA, COWS, AIMS and Fall Risk screenings as ordered, Perform wound care treatments as ordered.  Evaluation of Outcomes: Not Met   LCSW Treatment Plan for Primary Diagnosis: <principal problem not specified> Long Term Goal(s): Safe transition to appropriate next level of care at discharge, Engage patient in therapeutic group addressing interpersonal concerns.  Short Term Goals: Engage  patient in aftercare planning with referrals and resources, Increase social support and Increase skills for wellness and recovery  Therapeutic Interventions: Assess for all discharge needs, 1 to 1 time with Social worker, Explore available resources and support systems, Assess for adequacy in community support network, Educate family and significant other(s) on suicide prevention, Complete Psychosocial Assessment, Interpersonal group therapy.  Evaluation of Outcomes: Not Met   Progress in Treatment: Attending groups: No. Participating in groups: No. Taking medication as prescribed: Yes. Toleration medication: Yes. Family/Significant other contact made: No, will contact:  when given permission Patient understands diagnosis: Yes. Discussing patient identified problems/goals with staff: Yes. Medical problems stabilized or resolved: Yes. Denies suicidal/homicidal ideation: Yes. Issues/concerns per patient self-inventory: No Other: none  New problem(s) identified: No, Describe:  none  New Short Term/Long Term Goal(s):  Patient Goals:  "stop hearing voices"  Discharge Plan or Barriers:   Reason for Continuation of Hospitalization: Hallucinations Medication stabilization  Estimated Length of Stay: 3-5 days.  Attendees: Patient: James [name] 07/31/2018   Physician: Dr. Jake Samples, MD 07/31/2018   Nursing: Boyce Medici, RN 07/31/2018   RN Care Manager: 07/31/2018   Social Worker: Lurline Idol, LCSW 07/31/2018   Recreational Therapist:  07/31/2018   Other:  07/31/2018   Other:  07/31/2018   Other: 07/31/2018     Scribe for  Treatment Team: [place], [place], [place] 07/31/2018 9:50 AM

## 2018-08-01 MED ORDER — OMEGA-3-ACID ETHYL ESTERS 1 G PO CAPS: 1.0000 g | ORAL_CAPSULE | Freq: Two times a day (BID) | ORAL | 2 refills | Status: DC

## 2018-08-01 MED ORDER — RISPERIDONE 3 MG PO TABS: 6.0000 mg | ORAL_TABLET | Freq: Every day | ORAL | 2 refills | Status: DC

## 2018-08-01 MED ORDER — TRAZODONE HCL 150 MG PO TABS: 150.0000 mg | ORAL_TABLET | Freq: Every evening | ORAL | 1 refills | Status: DC | PRN

## 2018-08-01 MED ORDER — PRENATAL MULTIVITAMIN CH: 1.0000 | ORAL_TABLET | Freq: Every day | ORAL | 1 refills | Status: DC

## 2018-08-01 MED ORDER — BENZTROPINE MESYLATE 0.5 MG PO TABS: 0.5000 mg | ORAL_TABLET | Freq: Two times a day (BID) | ORAL | 2 refills | Status: DC
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
## 2018-08-01 NOTE — Progress Notes (Signed)
D:  Patient denied SI and HI.  Denied A/V hallucinations.  Denied pain. A:  Medications administered per MD orders.  Emotional support and encouragement given patient. R:  Safety maintained with 15 minute checks. Patient has been laying in bed this morning.  Patient rolled over and lifted his head to talk to nurse.

## 2018-08-01 NOTE — Progress Notes (Signed)
CSW informed that pt insurance terminated last fall.  Called pt and pt mother, they contacted insurance and it was a paperwork issue that they are fixing.  Informed them of [facility] appt 08/06/18 0930.  They will call Dr Jackquline Berlin once they have confirmation of insurance.  Pt was not sent home with medication due to having BCBS--mother will go to [facility] pharmacy to get prescriptions filled. Garner Nash, MSW, LCSW Clinical Social Worker 08/01/2018 3:03 PM

## 2018-08-01 NOTE — Progress Notes (Signed)
Patient ID: James [name], male   DOB: [birth year], 28 y.o.   MRN: [id number] Patient discharged to home/self care in the presence of his mother.   Patient denies SI, HI and AVH upon discharge.  Patient acknowledged understanding of discharge instructions and receipt of all personal belongings from locker.  Patient was in a bright mood upon discharge.

## 2018-08-01 NOTE — BHH Group Notes (Signed)
Adult Psychoeducational Group Note  Date:  08/01/2018 Time:  8:43 AM  Group Topic/Focus:  Goals Group:   The focus of this group is to help patients establish daily goals to achieve during treatment and discuss how the patient can incorporate goal setting into their daily lives to aide in recovery.  Participation Level:  Active  Participation Quality:  Appropriate  Affect:  Appropriate  Cognitive:  Appropriate  Insight: Appropriate  Engagement in Group:  Engaged  Modes of Intervention:  Discussion  Additional Comments:  Pt attended morning goals group.   James [name] Kevin Space 08/01/2018, 8:43 AM

## 2018-08-01 NOTE — BHH Suicide Risk Assessment (Signed)
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)   Schizophreniform disorder (HCC)   Brief psychotic disorder (HCC)   Total Time spent with patient: 45 minutes  Currently patient is alert and oriented and cooperative without thoughts of harming self or others and reporting a resolution of the previously expressed auditory hallucinations Mental Status Per Nursing Assessment::   On Admission:  NA  Demographic Factors:  Male  Loss Factors: NA  Historical Factors: neg  Risk Reduction Factors:   Religious beliefs about death  Continued Clinical Symptoms:  Schizophrenia:   Paranoid or undifferentiated type  Cognitive Features That Contribute To Risk:  Loss of executive function    Suicide Risk:  Minimal: No identifiable suicidal ideation.  Patients presenting with no risk factors but with morbid ruminations; may be classified as minimal risk based on the severity of the depressive symptoms  Follow-up Information    [facility] Follow up on 08/09/2018.   Why:  Hospital follow up with Dr. Maggie Schwalbe is Friday, 4/17 at 3:00p.  Please bring your photo ID, SSN, current medications and any discharge paperwork from this hospitalization.  Contact information: [address] [place] [address] Phone: [phone] Fax: [phone]           Plan Of Care/Follow-up recommendations:  Activity:  full  [place]opolis Sloop, MD 08/01/2018, 9:15 AM

## 2018-08-01 NOTE — Progress Notes (Signed)
Patient is out of bed and attending group this morning.  Smiling, talking to peers and staff.  Patient's self inventory sheet, patient sleeps good, no sleep medication.  Good appetite, normal energy level, good concentration.  Denied depression, hopeless and anxiety.  Denied withdrawals.  Denied SI.  Denied physical pain.  Goal is sleeping and going home.  Plans to take pills and rest.  Does have discharge plans.

## 2018-08-01 NOTE — Progress Notes (Signed)
Recreation Therapy Notes  Date: 4.9.20 Time: 1000 Location: 500 Hall Dayroom  Group Topic: Communication, Team Building, Problem Solving  Goal Area(s) Addresses:  Patient will effectively work with peer towards shared goal.  Patient will identify skill used to make activity successful.  Patient will identify how skills used during activity can be used to reach post d/c goals.   Behavioral Response: Minimal  Intervention: STEM Activity   Activity: In team's, using 20 small plastic cups, patients were asked to build the tallest free standing tower possible.    Education: Pharmacist, community, Building control surveyor.   Education Outcome: Acknowledges education/In group clarification offered/Needs additional education.   Clinical Observations/Feedback:  Pt was quiet but engaged.  Pt was called out of group to speak with Child psychotherapist.  Pt did not return.     Caroll Rancher, LRT/CTRS     Caroll Rancher A 08/01/2018 10:54 AM

## 2018-08-01 NOTE — Progress Notes (Signed)
  [facility] Adult Case Management Discharge Plan :  Will you be returning to the same living situation after discharge:  Yes,  with mother At discharge, do you have transportation home?: Yes,  mother Do you have the ability to pay for your medications: Yes,  BCBS  Release of information consent forms completed and in the chart;  Patient's signature needed at discharge.  Patient to Follow up at: Follow-up Information    [facility] Follow up on 08/09/2018.   Why:  Hospital follow up with Dr. Maggie Schwalbe is Friday, 4/17 at 3:00p.  Please bring your photo ID, SSN, current medications and any discharge paperwork from this hospitalization.  Contact information: [address] [place] [address] Phone: [phone] Fax: [phone]           Next level of care provider has access to [facility] Link:no  Safety Planning and Suicide Prevention discussed: Yes,  with mother  Have you used any form of tobacco in the last 30 days? (Cigarettes, Smokeless Tobacco, Cigars, and/or Pipes): No  Has patient been referred to the Quitline?: N/A patient is not a smoker  Patient has been referred for addiction treatment: Pt. refused referral  Hayk, Mcquillen, LCSW 08/01/2018, 9:46 AM

## 2018-08-02 ENCOUNTER — Encounter (HOSPITAL_COMMUNITY): Payer: Self-pay

## 2018-08-02 ENCOUNTER — Other Ambulatory Visit: Payer: Self-pay

## 2018-08-02 ENCOUNTER — Emergency Department (HOSPITAL_COMMUNITY)
Admission: EM | Admit: 2018-08-02 | Discharge: 2018-08-02 | Disposition: A | Discharge status: Home / Self Care | Payer: BLUE CROSS/BLUE SHIELD | Attending: Emergency Medicine | Admitting: Emergency Medicine

## 2018-08-02 DIAGNOSIS — Z79899 Other long term (current) drug therapy: Secondary | ICD-10-CM | POA: Diagnosis not present

## 2018-08-02 DIAGNOSIS — Z87820 Personal history of traumatic brain injury: Secondary | ICD-10-CM | POA: Diagnosis not present

## 2018-08-02 DIAGNOSIS — W260XXA Contact with knife, initial encounter: Secondary | ICD-10-CM | POA: Insufficient documentation

## 2018-08-02 DIAGNOSIS — Y93G1 Activity, food preparation and clean up: Secondary | ICD-10-CM | POA: Diagnosis not present

## 2018-08-02 DIAGNOSIS — S61211A Laceration without foreign body of left index finger without damage to nail, initial encounter: Secondary | ICD-10-CM | POA: Diagnosis not present

## 2018-08-02 DIAGNOSIS — Y929 Unspecified place or not applicable: Secondary | ICD-10-CM | POA: Insufficient documentation

## 2018-08-02 DIAGNOSIS — F209 Schizophrenia, unspecified: Secondary | ICD-10-CM | POA: Insufficient documentation

## 2018-08-02 DIAGNOSIS — Z23 Encounter for immunization: Secondary | ICD-10-CM | POA: Diagnosis not present

## 2018-08-02 DIAGNOSIS — F1290 Cannabis use, unspecified, uncomplicated: Secondary | ICD-10-CM | POA: Diagnosis not present

## 2018-08-02 DIAGNOSIS — Z87891 Personal history of nicotine dependence: Secondary | ICD-10-CM | POA: Diagnosis not present

## 2018-08-02 MED ORDER — TETANUS-DIPHTH-ACELL PERTUSSIS 5-2.5-18.5 LF-MCG/0.5 IM SUSP
0.5000 mL | Freq: Once | INTRAMUSCULAR | Status: AC
  Administered 2018-08-02: 0.5 mL via INTRAMUSCULAR
  Filled 2018-08-02: qty 0.5

## 2018-08-02 MED ORDER — BACITRACIN ZINC 500 UNIT/GM EX OINT
TOPICAL_OINTMENT | Freq: Two times a day (BID) | CUTANEOUS | Status: DC
  Administered 2018-08-02: 23:00:00 via TOPICAL

## 2018-08-02 MED ORDER — LIDOCAINE HCL (PF) 1 % IJ SOLN
5.0000 mL | Freq: Once | INTRAMUSCULAR | Status: AC
  Administered 2018-08-02: 23:00:00 5 mL
  Filled 2018-08-02: qty 5

## 2018-08-02 NOTE — ED Notes (Signed)
Patient verbalizes understanding of discharge instructions. Opportunity for questioning and answers were provided. Armband removed by staff, pt discharged from ED.  

## 2018-08-02 NOTE — ED Provider Notes (Signed)
[facility] EMERGENCY DEPARTMENT Provider Note   CSN: [id number] Arrival date & time: 08/02/18  2056    History   Chief Complaint Chief Complaint  Patient presents with  . Finger Injury    HPI James [name] is a 28 y.o. male.     Patient to ED with c/o laceration to left distal index finger. He was using a knife to open a food package and his hand slipped causing small cut to finger. No other injury. Tetanus is out of date.     History reviewed. No pertinent past medical history.  Patient Active Problem List   Diagnosis Date Noted  . 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 Brief psychotic disorder (HCC) 07/30/2018  . TBI (traumatic brain injury) (HCC) 09/16/2017  . 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 HAND SURGERY          Home Medications    Prior to Admission medications   Medication Sig Start Date End Date Taking? Authorizing Provider  benztropine (COGENTIN) 0.5 MG tablet Take 1 tablet (0.5 mg total) by mouth 2 (two) times daily. 08/01/18   Malvin Johns, MD  fluticasone (FLONASE) 50 MCG/ACT nasal spray Place 1 spray into both nostrils daily. Patient not taking: Reported on 07/30/2018 12/21/17   Dahlia Byes A, NP  omega-3 acid ethyl esters (LOVAZA) 1 g capsule Take 1 capsule (1 g total) by mouth 2 (two) times daily. 08/01/18   Malvin Johns, MD  ondansetron (ZOFRAN ODT) 4 MG disintegrating tablet Take 1 tablet (4 mg total) by mouth every 8 (eight) hours as needed for nausea or vomiting. Patient not taking: Reported on 07/30/2018 09/20/17   Ward, Chase Picket, PA-C  Prenatal Vit-Fe Fumarate-FA (PRENATAL MULTIVITAMIN) TABS tablet Take 1 tablet by mouth daily at 12 noon. 08/01/18   Malvin Johns, MD  risperiDONE (RISPERDAL) 3 MG tablet Take 2 tablets (6 mg total) by mouth at bedtime. 08/01/18   Malvin Johns, MD  traZODone  (DESYREL) 150 MG tablet Take 1 tablet (150 mg total) by mouth at bedtime as needed for sleep. 08/01/18   Malvin Johns, MD    Family History Family History  Problem Relation Age of Onset  . Healthy Mother   . Healthy Father     Social History Social History   Tobacco Use  . Smoking status: Former Smoker    Packs/day: 0.50    Types: Cigarettes  . Smokeless tobacco: Never Used  Substance Use Topics  . Alcohol use: Not Currently    Comment: every day  . Drug use: Yes    Types: Marijuana     Allergies   Patient has no known allergies.   Review of Systems Review of Systems  Musculoskeletal:       See HPI.  Skin: Positive for wound.     Physical Exam Updated Vital Signs BP 135/78 (BP Location: Right Arm)   Pulse 96   Temp 98.3 F (36.8 C) (Oral)   Resp 16   Ht 5\' 10"  (1.778 m)   Wt 72.6 kg   SpO2 100%   BMI 22.96 kg/m   Physical Exam Constitutional:      Appearance: He is well-developed.  Neck:     Musculoskeletal: Normal range of motion.  Pulmonary:     Effort: Pulmonary effort is normal.  Musculoskeletal: Normal range of motion.  Skin:    General: Skin is warm and dry.  Comments: 1.5 cm linear laceration pad of left index finger. Active bleeding. No swelling.   Neurological:     Mental Status: He is alert and oriented to person, place, and time.      ED Treatments / Results  Labs (all labs ordered are listed, but only abnormal results are displayed) Labs Reviewed - No data to display  EKG None  Radiology No results found.  Procedures .Marland Kitchen.Laceration Repair Date/Time: 08/02/2018 10:38 PM Performed by: Elpidio AnisUpstill, Navy Rothschild, PA-C Authorized by: Elpidio AnisUpstill, Nini Cavan, PA-C   Consent:    Consent obtained:  Verbal   Consent given by:  Patient Anesthesia (see MAR for exact dosages):    Anesthesia method:  Local infiltration   Local anesthetic:  Lidocaine 1% w/o epi Laceration details:    Location:  Finger   Finger location:  L index finger   Length  (cm):  1.5 Repair type:    Repair type:  Simple Pre-procedure details:    Preparation:  Patient was prepped and draped in usual sterile fashion Exploration:    Hemostasis achieved with:  Direct pressure   Wound extent: no foreign bodies/material noted     Contaminated: no   Treatment:    Area cleansed with:  Betadine and saline   Amount of cleaning:  Standard Skin repair:    Repair method:  Sutures   Suture size:  5-0   Suture material:  Prolene   Number of sutures:  3 Approximation:    Approximation:  Close Post-procedure details:    Patient tolerance of procedure:  Tolerated well, no immediate complications   (including critical care time)  Medications Ordered in ED Medications - No data to display   Initial Impression / Assessment and Plan / ED Course  I have reviewed the triage vital signs and the nursing notes.  Pertinent labs & imaging results that were available during my care of the patient were reviewed by me and considered in my medical decision making (see chart for details).        Patient to ED with simple laceration of left index finger, repaired as per above note.   Final Clinical Impressions(s) / ED Diagnoses   Final diagnoses:  None   1. Laceration left index finger.  ED Discharge Orders    None       Danne HarborUpstill, Santita Hunsberger, PA-C 08/02/18 2242    Azalia Bilisampos, Kevin, MD 08/02/18 2340

## 2018-08-02 NOTE — ED Triage Notes (Signed)
Pt presents for eval of laceration to left index finger. Pt states she was attempting to cut open a pack of steak, but got distracted telling his kids to sit down, and cut his finger. Small lac noted to finger, slightly bleeding. Pt told to apply pressure.

## 2018-08-02 NOTE — Discharge Instructions (Addendum)
Sutures will need to be removed in 7-10 days. This can be done at any Urgent Care.

## 2018-08-13 ENCOUNTER — Ambulatory Visit (HOSPITAL_COMMUNITY)
Admission: EM | Admit: 2018-08-13 | Discharge: 2018-08-13 | Disposition: A | Discharge status: Home / Self Care | Payer: BLUE CROSS/BLUE SHIELD

## 2018-08-13 ENCOUNTER — Other Ambulatory Visit: Payer: Self-pay

## 2018-08-13 DIAGNOSIS — Z4802 Encounter for removal of sutures: Secondary | ICD-10-CM

## 2018-08-13 NOTE — ED Triage Notes (Signed)
Pt presents to have 4 sutures removed from left index finger.

## 2018-08-29 DIAGNOSIS — J343 Hypertrophy of nasal turbinates: Secondary | ICD-10-CM | POA: Insufficient documentation

## 2018-08-29 DIAGNOSIS — M950 Acquired deformity of nose: Secondary | ICD-10-CM | POA: Insufficient documentation

## 2018-08-29 DIAGNOSIS — J342 Deviated nasal septum: Secondary | ICD-10-CM | POA: Insufficient documentation

## 2018-12-18 ENCOUNTER — Ambulatory Visit (HOSPITAL_COMMUNITY)
Admission: EM | Admit: 2018-12-18 | Discharge: 2018-12-18 | Disposition: A | Discharge status: Home / Self Care | Payer: BC Managed Care – PPO | Attending: Internal Medicine | Admitting: Internal Medicine

## 2018-12-18 ENCOUNTER — Other Ambulatory Visit: Payer: Self-pay

## 2018-12-18 ENCOUNTER — Encounter (HOSPITAL_COMMUNITY): Payer: Self-pay

## 2018-12-18 DIAGNOSIS — Z202 Contact with and (suspected) exposure to infections with a predominantly sexual mode of transmission: Secondary | ICD-10-CM | POA: Diagnosis present

## 2018-12-18 DIAGNOSIS — R0789 Other chest pain: Secondary | ICD-10-CM | POA: Diagnosis not present

## 2018-12-18 NOTE — ED Provider Notes (Signed)
[place]    CSN: [id number] Arrival date & time: 12/18/18  [place]      History   Chief Complaint Chief Complaint  Patient presents with  . Chest Pain    HPI James [name] is a 28 y.o. male with a history of schizophrenia comes to urgent care with complaints of sharp left-sided chest pain of 3 weeks duration.  Patient says pain started insidiously and is been persistent.  Pain is sharp, mild, worsened by movement and palpation over his chest.  Pain is nonradiating.  Denies any relieving factors.  Patient denies any diaphoresis, nausea vomiting, shortness of breath.  No dizziness or lightheadedness.  Patient will like to be screened for STD because he recently has a new sexual partner and is been engaging in unprotected sex with this person.  HPI  History reviewed. No pertinent past medical history.  Patient Active Problem List   Diagnosis Date Noted  . 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 TBI (traumatic brain injury) ([place]) 09/16/2017  . 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 HAND SURGERY         Home Medications    Prior to Admission medications   Medication Sig Start Date End Date Taking? Authorizing Provider  ARIPiprazole (ABILIFY) 20 MG tablet Take 20 mg by mouth daily.   Yes [provider]  propranolol (INDERAL) 20 MG tablet Take 20 mg by mouth 3 (three) times daily.   Yes [provider]  benztropine (COGENTIN) 0.5 MG tablet Take 1 tablet (0.5 mg total) by mouth 2 (two) times daily. 08/01/18   Johnn Hai, MD  fluticasone (FLONASE) 50 MCG/ACT nasal spray Place 1 spray into both nostrils daily. Patient not taking: Reported on 07/30/2018 12/21/17   Loura Halt A, NP  omega-3 acid ethyl esters (LOVAZA) 1 g capsule Take 1 capsule (1 g total) by mouth 2 (two) times  daily. 08/01/18   Johnn Hai, MD  ondansetron (ZOFRAN ODT) 4 MG disintegrating tablet Take 1 tablet (4 mg total) by mouth every 8 (eight) hours as needed for nausea or vomiting. Patient not taking: Reported on 07/30/2018 09/20/17   Ward, Ozella Almond, PA-C  Prenatal Vit-Fe Fumarate-FA (PRENATAL MULTIVITAMIN) TABS tablet Take 1 tablet by mouth daily at 12 noon. 08/01/18   Johnn Hai, MD  risperiDONE (RISPERDAL) 3 MG tablet Take 2 tablets (6 mg total) by mouth at bedtime. 08/01/18   Johnn Hai, MD  traZODone (DESYREL) 150 MG tablet Take 1 tablet (150 mg total) by mouth at bedtime as needed for sleep. 08/01/18   Johnn Hai, MD    Family History Family History  Problem Relation Age of Onset  . Healthy Mother   . Healthy Father     Social History Social History   Tobacco Use  . Smoking status: Former Smoker    Packs/day: 0.50    Types: Cigarettes  . Smokeless tobacco: Never Used  Substance Use Topics  . Alcohol use: Not Currently    Comment: every day  . Drug use: Yes    Types: Marijuana     Allergies   Patient has no known allergies.   Review of Systems Review of Systems  Constitutional: Negative.   Respiratory: Negative for cough, chest tightness, shortness of breath and wheezing.   Cardiovascular: Positive for chest pain. Negative for palpitations.  Gastrointestinal: Negative.   Genitourinary: Negative  for discharge, dysuria, flank pain, frequency, genital sores, penile pain, penile swelling, scrotal swelling and testicular pain.  Musculoskeletal: Negative.      Physical Exam Triage Vital Signs ED Triage Vitals [12/18/18 1709]  Enc Vitals Group     BP      Pulse      Resp      Temp      Temp src      SpO2      Weight      Height      Head Circumference      Peak Flow      Pain Score 8     Pain Loc      Pain Edu?      Excl. in GC?    No data found.  Updated Vital Signs There were no vitals taken for this visit.  Visual Acuity Right Eye Distance:    Left Eye Distance:   Bilateral Distance:    Right Eye Near:   Left Eye Near:    Bilateral Near:     Physical Exam Constitutional:      Appearance: He is well-developed. He is not ill-appearing, toxic-appearing or diaphoretic.  Neck:     Musculoskeletal: Normal range of motion and neck supple.  Cardiovascular:     Rate and Rhythm: Normal rate and regular rhythm.  No extrasystoles are present.    Heart sounds: Normal heart sounds. Heart sounds not distant.  Pulmonary:     Effort: Pulmonary effort is normal. No tachypnea or accessory muscle usage.     Breath sounds: No decreased breath sounds, wheezing or rhonchi.  Chest:     Chest wall: Tenderness present.     Comments: Tenderness on palpation over the left costochondral joints Abdominal:     General: Bowel sounds are normal.     Palpations: Abdomen is soft.  Neurological:     Mental Status: He is alert.      UC Treatments / Results  Labs (all labs ordered are listed, but only abnormal results are displayed) Labs Reviewed - No data to display  EKG   Radiology No results found.  Procedures Procedures (including critical care time)  Medications Ordered in UC Medications - No data to display  Initial Impression / Assessment and Plan / UC Course  I have reviewed the triage vital signs and the nursing notes.  Pertinent labs & imaging results that were available during my care of the patient were reviewed by me and considered in my medical decision making (see chart for details).     1.  Costochondritis: Warm or cold compress Ibuprofen as needed for pain Gentle stretches Return to urgent care/ED if chest pain worsens or patient develops associated shortness of breath, diaphoresis, lightheadedness or nausea vomiting  2.  Exposure to STD: Urethral swab for GC/chlamydia HIV testing per patient request Recent HIV and RPR testing in January was negative Final Clinical Impressions(s) / UC Diagnoses   Final  diagnoses:  None   Discharge Instructions   None    ED Prescriptions    None     Controlled Substance Prescriptions [place] Controlled Substance Registry consulted? No   Merrilee JanskyLamptey, Daijah Scrivens O, MD 12/18/18 Rickey [id number]

## 2018-12-18 NOTE — ED Triage Notes (Signed)
Pt presents to UC with c/o chest pain that gets worse when touched and when being active x3 days. Pt reports taking ibuprofen without relief. Pt reports some SOB

## 2018-12-19 LAB — HIV ANTIBODY (ROUTINE TESTING W REFLEX): HIV Screen 4th Generation wRfx: NONREACTIVE

## 2018-12-20 LAB — CYTOLOGY, (ORAL, ANAL, URETHRAL) ANCILLARY ONLY
Chlamydia: NEGATIVE
Neisseria Gonorrhea: NEGATIVE
Trichomonas: NEGATIVE

## 2019-05-05 IMAGING — CT CT HEAD W/O CM
3 of 4 series · 13 of 47 positions shown, 15 images · non-contrast
Comparison: 09/16/2017

CLINICAL DATA: Ataxia and head trauma

EXAM:
CT HEAD WITHOUT CONTRAST
TECHNIQUE: Contiguous axial images were obtained from the base of the skull
through the vertex without intravenous contrast.

[Series 3: head wo · axial · 0.39mm/px · z∈[-139,-24]mm · 7 of 31 slices shown, 9 images]
[im 4/31  brain]
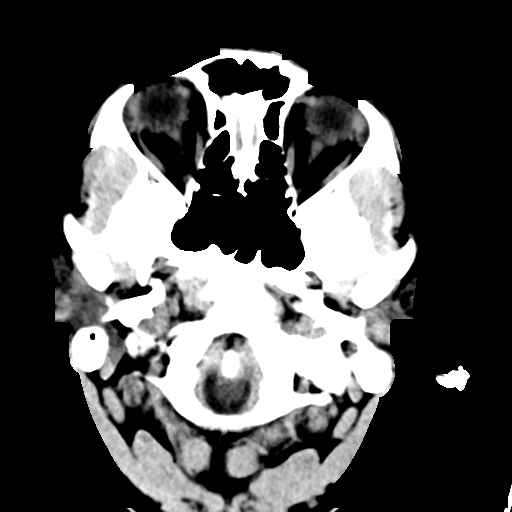
[im 4/31  bone]
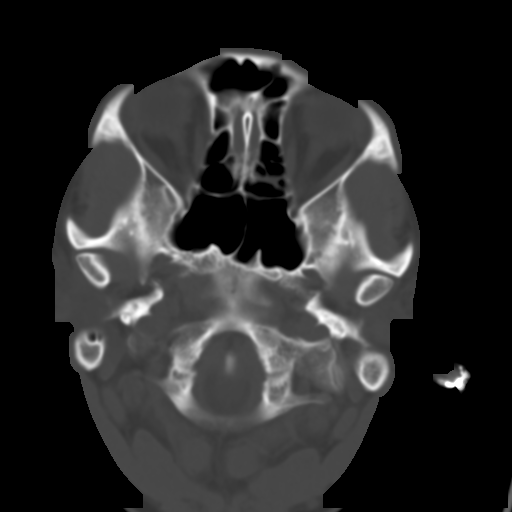
[im 8/31  brain]
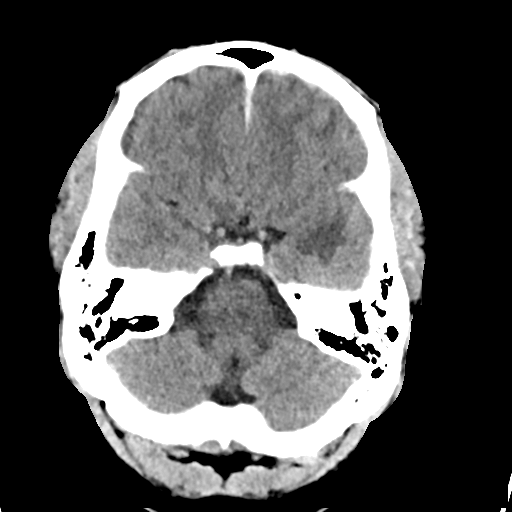
[im 12/31  brain]
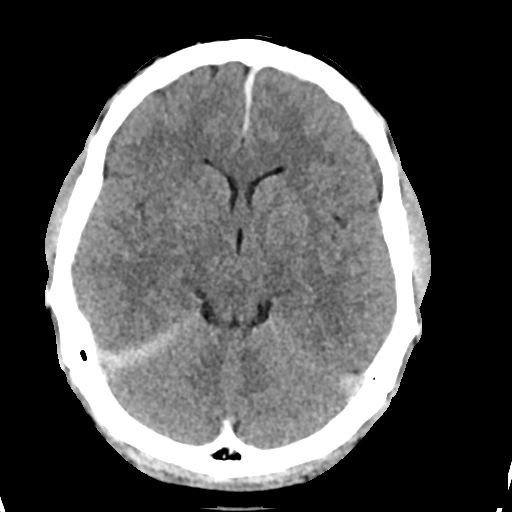
[im 16/31  brain]
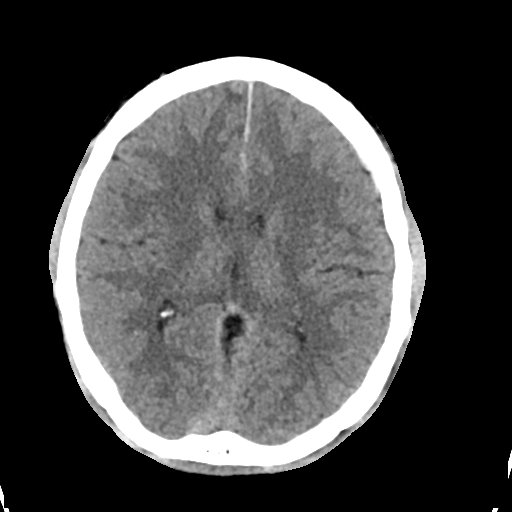
[im 19/31  brain]
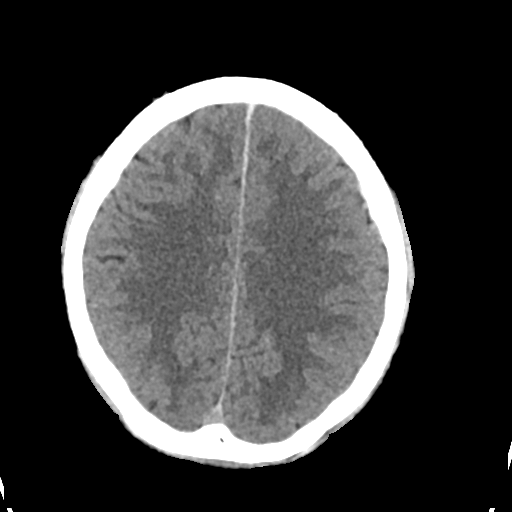
[im 19/31  bone]
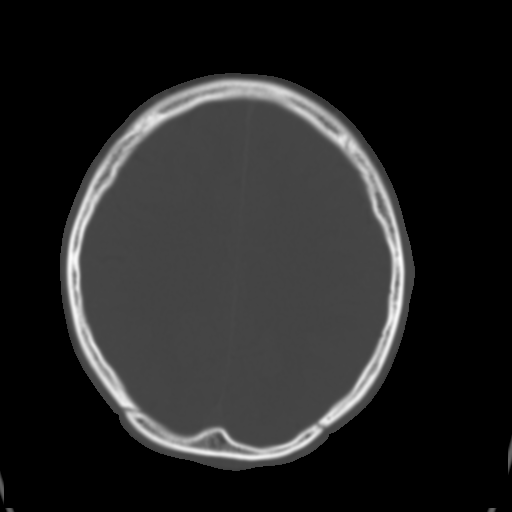
[im 23/31  brain]
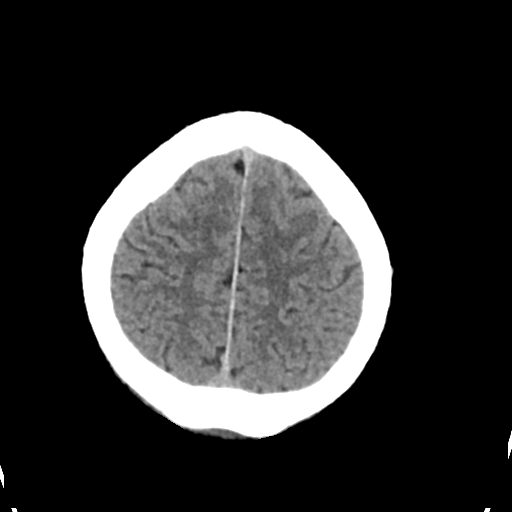
[im 27/31  brain]
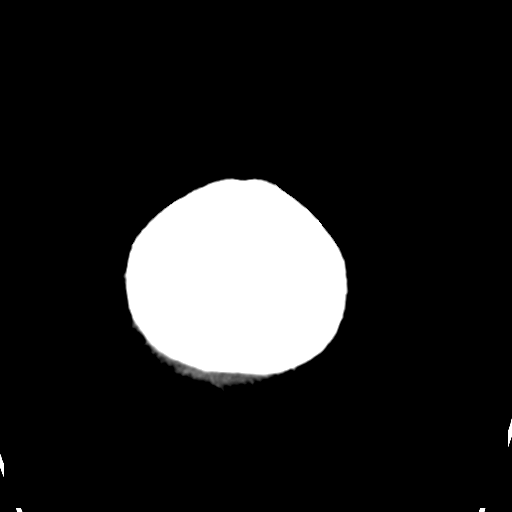

[Series 5: cor soft · coronal · 0.30mm/px · 3 of 56 slices shown]
[im 19/56  brain]
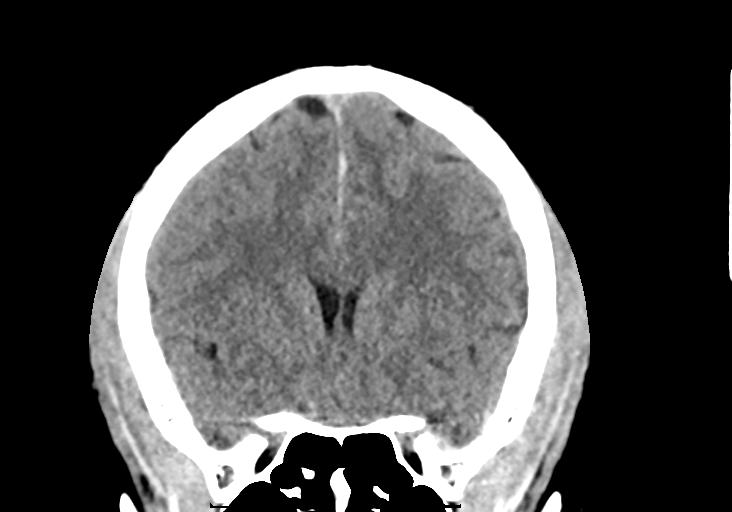
[im 25/56  brain]
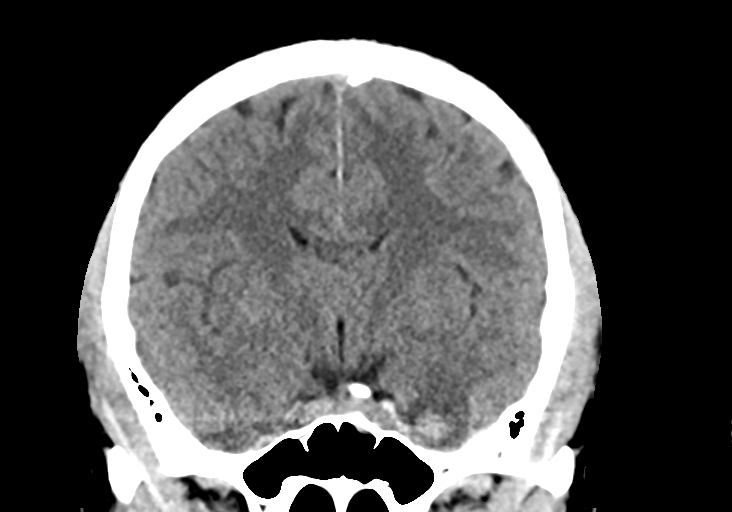
[im 31/56  brain]
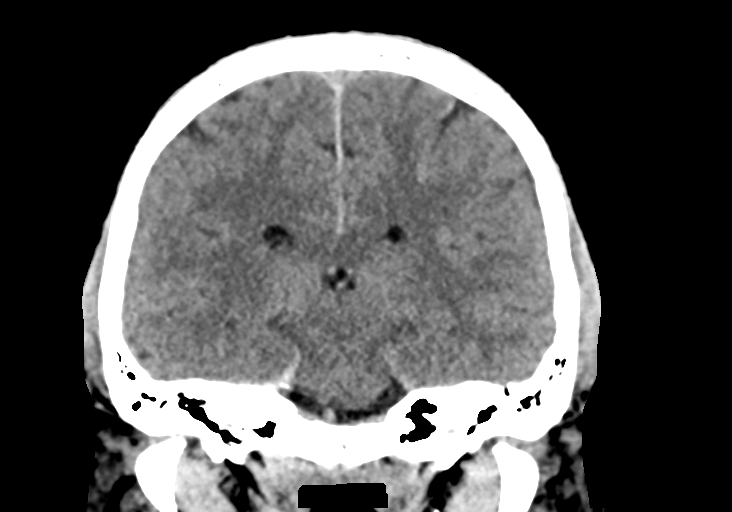

[Series 6: sag soft · sagittal · 0.30mm/px · 3 of 53 slices shown]
[im 18/53  brain]
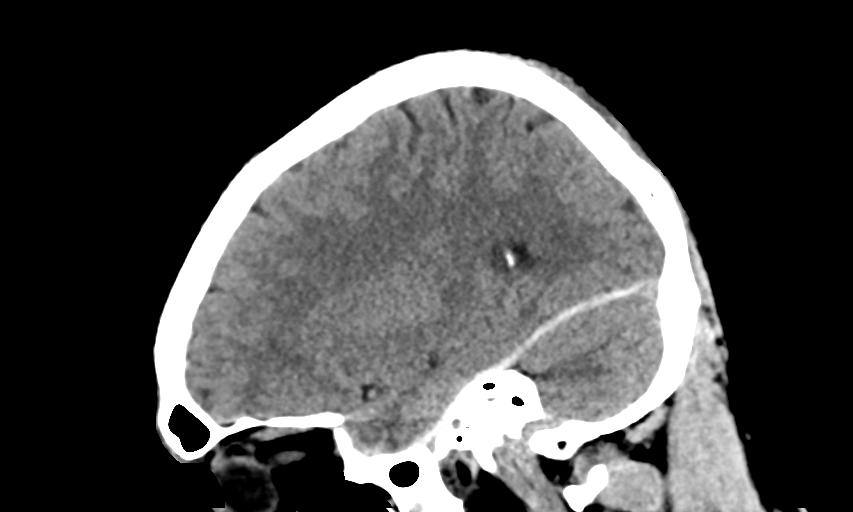
[im 27/53  brain]
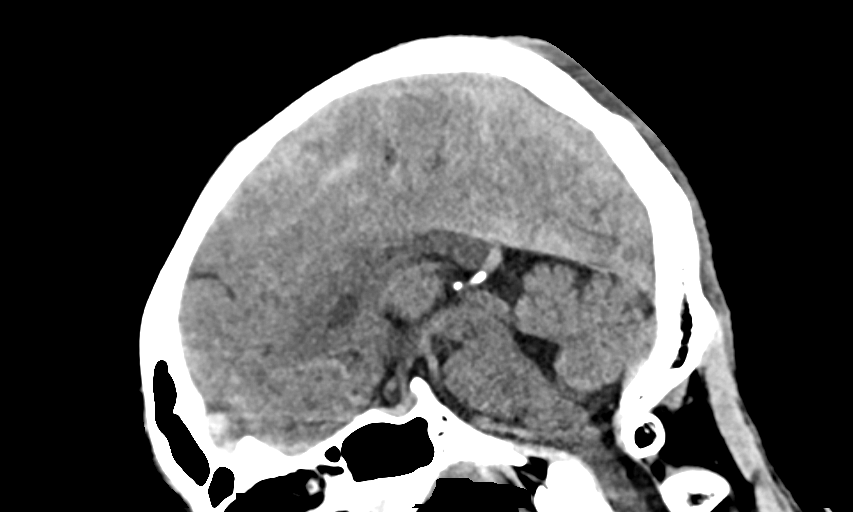
[im 35/53  brain]
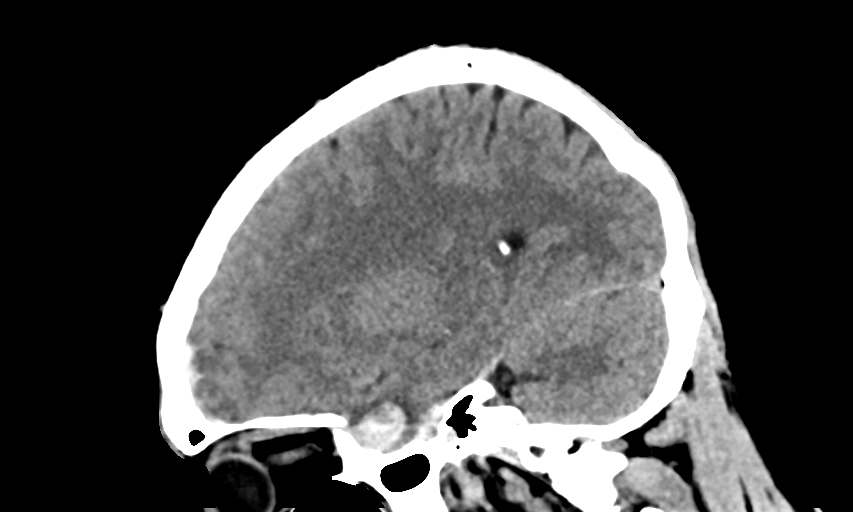

[13 of 47 positions shown; findings below may reference images not displayed]

FINDINGS: Brain: Hemorrhagic contusions in the inferior frontal poles and
anterior left temporal lobe are unchanged. Small volume subdural
hematoma along the falx cerebri and right cerebellar tentorial
leaflet are unchanged. There is no new hemorrhage or mass effect.
The size and configuration of the ventricles and extra-axial CSF
spaces are normal. There is no acute or chronic infarction. Small
volume pneumocephalus adjacent to the temporal occipital sutures is
unchanged.

Vascular: No abnormal hyperdensity of the major intracranial
arteries or dural venous sinuses. No intracranial atherosclerosis.

Skull: Redemonstration of bilateral diastatic fractures of the
temporal occipital sutures.

Sinuses/Orbits: No fluid levels or advanced mucosal thickening of
the visualized paranasal sinuses. No mastoid or middle ear effusion.
The orbits are normal.
IMPRESSION: 1. Unchanged distribution of intraparenchymal contusion and subdural
hematoma.
2. Unchanged bilateral diastatic fractures of the temporal occipital
sutures with associated small volume pneumocephalus.

## 2019-05-17 ENCOUNTER — Other Ambulatory Visit: Payer: Self-pay

## 2019-05-17 ENCOUNTER — Encounter (HOSPITAL_COMMUNITY): Payer: Self-pay | Admitting: *Deleted

## 2019-05-17 ENCOUNTER — Ambulatory Visit (HOSPITAL_COMMUNITY)
Admission: EM | Admit: 2019-05-17 | Discharge: 2019-05-17 | Disposition: A | Discharge status: Home / Self Care | Payer: BC Managed Care – PPO | Attending: Physician Assistant | Admitting: Physician Assistant

## 2019-05-17 DIAGNOSIS — Z20822 Contact with and (suspected) exposure to covid-19: Secondary | ICD-10-CM | POA: Diagnosis present

## 2019-05-17 DIAGNOSIS — R439 Unspecified disturbances of smell and taste: Secondary | ICD-10-CM

## 2019-05-17 DIAGNOSIS — Z113 Encounter for screening for infections with a predominantly sexual mode of transmission: Secondary | ICD-10-CM

## 2019-05-17 HISTORY — DX: Insomnia, unspecified: G47.00

## 2019-05-17 LAB — HIV ANTIBODY (ROUTINE TESTING W REFLEX): HIV Screen 4th Generation wRfx: NONREACTIVE

## 2019-05-17 NOTE — Discharge Instructions (Signed)
Check mychart for results. Use condoms. Follow up with PCP.

## 2019-05-17 NOTE — ED Triage Notes (Signed)
Denies any penile discharge, but "I like to get checked every once in a while". Also wishes to have Covid test.  Denies sxs, but states "I have a weird smell in my nose".  Denies any loss of smell or taste.

## 2019-05-17 NOTE — ED Provider Notes (Signed)
[place]    CSN: [id number] Arrival date & time: 05/17/19  1033      History   Chief Complaint Chief Complaint  Patient presents with  . Exposure to STD  . Covid test    HPI James [name] is a 29 y.o. male.   Patient here requesting STD screen.  He is sexually active, 1 male partner, with occasional condom use.  No known exposures to STDs.  Denies penile d/c, dysuria, frequency, urgency, hematuria, testicular pain/swelling.    He is also requesting covid19 test.  No known exposures.  He notes a "different" smell x 10 days only in th AM, which he cant describe.  Denies f/c, URI sx, cough, wheezing  SOB.  He denies history of heartburn, bloating, sour metallic taste.  Denies environmental allergies.       Past Medical History:  Diagnosis Date  . Insomnia     Patient Active Problem List   Diagnosis Date Noted  . 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 Brief psychotic disorder ([place]) 07/30/2018  . TBI (traumatic brain injury) ([place]) 09/16/2017  . 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 HAND SURGERY         Home Medications    Prior to Admission medications   Medication Sig Start Date End Date Taking? Authorizing Provider  ARIPiprazole (ABILIFY) 20 MG tablet Take 20 mg by mouth daily.    [provider]  propranolol (INDERAL) 20 MG tablet Take 20 mg by mouth 3 (three) times daily.    [provider]  benztropine (COGENTIN) 0.5 MG tablet Take 1 tablet (0.5 mg total) by mouth 2 (two) times daily. 08/01/18 12/18/18  Johnn Hai, MD  fluticasone (FLONASE) 50 MCG/ACT nasal spray Place 1 spray into both nostrils daily. Patient not taking: Reported on 07/30/2018 12/21/17 12/18/18  Loura Halt A, NP  omega-3 acid ethyl esters (LOVAZA) 1 g capsule Take 1 capsule (1 g total) by mouth 2 (two) times daily. 08/01/18 12/18/18   Johnn Hai, MD  risperiDONE (RISPERDAL) 3 MG tablet Take 2 tablets (6 mg total) by mouth at bedtime. 08/01/18 12/18/18  Johnn Hai, MD  traZODone (DESYREL) 150 MG tablet Take 1 tablet (150 mg total) by mouth at bedtime as needed for sleep. 08/01/18 12/18/18  Johnn Hai, MD    Family History Family History  Problem Relation Age of Onset  . Healthy Mother     Social History Social History   Tobacco Use  . Smoking status: Former Smoker    Packs/day: 0.50    Types: Cigarettes  . Smokeless tobacco: Never Used  Substance Use Topics  . Alcohol use: Yes    Comment: occasionally  . Drug use: Not Currently    Types: Marijuana     Allergies   Patient has no known allergies.   Review of Systems Review of Systems  Constitutional: Negative for activity change, appetite change, chills, fatigue and fever.  HENT: Negative for congestion, ear discharge, ear pain, nosebleeds, postnasal drip, rhinorrhea, sinus pressure, sinus pain, sneezing and sore throat.   Eyes: Negative for pain, discharge and itching.  Respiratory: Negative for cough, shortness of breath and wheezing.   Gastrointestinal: Negative for abdominal distention, abdominal pain, constipation, diarrhea, nausea and vomiting.  Genitourinary: Negative for decreased urine volume, difficulty urinating, dysuria, frequency, hematuria, penile pain, penile swelling, scrotal swelling and testicular pain.  Musculoskeletal: Negative for back pain and  gait problem.  Neurological: Negative for weakness, light-headedness, numbness and headaches.  Hematological: Negative for adenopathy.     Physical Exam Triage Vital Signs ED Triage Vitals  Enc Vitals Group     BP 05/17/19 1209 130/84     Pulse Rate 05/17/19 1209 80     Resp 05/17/19 1209 16     Temp 05/17/19 1209 98.3 F (36.8 C)     Temp Source 05/17/19 1209 Oral     SpO2 05/17/19 1209 97 %     Weight --      Height --      Head Circumference --      Peak Flow --      Pain  Score 05/17/19 1212 0     Pain Loc --      Pain Edu? --      Excl. in GC? --    No data found.  Updated Vital Signs BP 130/84   Pulse 80   Temp 98.3 F (36.8 C) (Oral)   Resp 16   SpO2 97%   Visual Acuity Right Eye Distance:   Left Eye Distance:   Bilateral Distance:    Right Eye Near:   Left Eye Near:    Bilateral Near:     Physical Exam Vitals and nursing note reviewed.  Constitutional:      Appearance: He is well-developed.  HENT:     Head: Normocephalic and atraumatic.     Nose: Nose normal. No congestion or rhinorrhea.     Mouth/Throat:     Pharynx: No oropharyngeal exudate or posterior oropharyngeal erythema.  Eyes:     Extraocular Movements: Extraocular movements intact.     Conjunctiva/sclera: Conjunctivae normal.  Cardiovascular:     Rate and Rhythm: Normal rate and regular rhythm.     Heart sounds: No murmur.  Pulmonary:     Effort: Pulmonary effort is normal. No respiratory distress.     Breath sounds: Normal breath sounds.  Abdominal:     Palpations: Abdomen is soft.     Tenderness: There is no abdominal tenderness.  Genitourinary:    Penis: Normal.      Testes: Normal.  Musculoskeletal:        General: Normal range of motion.     Cervical back: Neck supple.  Skin:    General: Skin is warm and dry.     Capillary Refill: Capillary refill takes less than 2 seconds.  Neurological:     General: No focal deficit present.     Mental Status: He is alert and oriented to person, place, and time.  Psychiatric:        Mood and Affect: Mood normal.        Behavior: Behavior normal.      UC Treatments / Results  Labs (all labs ordered are listed, but only abnormal results are displayed) Labs Reviewed  NOVEL CORONAVIRUS, NAA (HOSP ORDER, SEND-OUT TO REF LAB; TAT 18-24 HRS)  HIV ANTIBODY (ROUTINE TESTING W REFLEX)  RPR  CYTOLOGY, (ORAL, ANAL, URETHRAL) ANCILLARY ONLY    EKG   Radiology No results found.  Procedures Procedures (including  critical care time)  Medications Ordered in UC Medications - No data to display  Initial Impression / Assessment and Plan / UC Course  I have reviewed the triage vital signs and the nursing notes.  Pertinent labs & imaging results that were available during my care of the patient were reviewed by me and considered in my medical decision making (see chart  for details).      Final Clinical Impressions(s) / UC Diagnoses   Final diagnoses:  Screen for STD (sexually transmitted disease)  Sense of smell altered  COVID-19 ruled out     Discharge Instructions     Check mychart for results. Use condoms. Follow up with PCP.    ED Prescriptions    None     PDMP not reviewed this encounter.   Evern Core, PA-C 05/17/19 1256

## 2019-05-18 LAB — NOVEL CORONAVIRUS, NAA (HOSP ORDER, SEND-OUT TO REF LAB; TAT 18-24 HRS): SARS-CoV-2, NAA: NOT DETECTED

## 2019-05-19 LAB — CYTOLOGY, (ORAL, ANAL, URETHRAL) ANCILLARY ONLY
Chlamydia: NEGATIVE
Neisseria Gonorrhea: NEGATIVE
Trichomonas: NEGATIVE

## 2019-05-19 LAB — RPR: RPR Ser Ql: NONREACTIVE

## 2019-07-05 ENCOUNTER — Other Ambulatory Visit: Payer: Self-pay

## 2019-07-05 ENCOUNTER — Emergency Department (HOSPITAL_COMMUNITY)
Admission: EM | Admit: 2019-07-05 | Discharge: 2019-07-05 | Disposition: A | Discharge status: Home / Self Care | Payer: BC Managed Care – PPO | Attending: Emergency Medicine | Admitting: Emergency Medicine

## 2019-07-05 ENCOUNTER — Encounter (HOSPITAL_COMMUNITY): Payer: Self-pay | Admitting: Emergency Medicine

## 2019-07-05 DIAGNOSIS — Z20822 Contact with and (suspected) exposure to covid-19: Secondary | ICD-10-CM

## 2019-07-05 DIAGNOSIS — Z79899 Other long term (current) drug therapy: Secondary | ICD-10-CM | POA: Diagnosis not present

## 2019-07-05 DIAGNOSIS — Z87891 Personal history of nicotine dependence: Secondary | ICD-10-CM | POA: Diagnosis not present

## 2019-07-05 LAB — SARS CORONAVIRUS 2 (TAT 6-24 HRS): SARS Coronavirus 2: NEGATIVE

## 2019-07-05 NOTE — ED Notes (Signed)
Patient given discharge instructions patient verbalizes understanding. 

## 2019-07-05 NOTE — ED Provider Notes (Signed)
[facility] EMERGENCY DEPARTMENT Provider Note   CSN: [id number] Arrival date & time: 07/05/19  4401     History Chief Complaint  Patient presents with  . Cough    James [name] is a 29 y.o. male with no significant PMH who presents to the ED requesting COVID-19 testing.  Patient reports that he works for [employer] and that some of his coworkers have been sick recently, presumably with COVID-19.  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  He rarely is ever sick, however has been having intermittent nonproductive cough x3 days.  He has not been taking any over-the-counter medications for symptom relief as he only coughs a few times each day and it has not been impairing his livelihood.  However, his employer is now requiring that he obtain COVID-19 testing before returning to work.  He is denying any fevers or chills, headache or dizziness, chest pain or difficulty breathing, abdominal pain, nausea or vomiting, changes in bowel habits, urinary symptoms, congestion, sore throat, itchy eyes, loss of smell, loss of taste, fatigue, body aches, or any other symptoms.  Patient states that he is eating meals regularly and drink plenty of fluids.  HPI     Past Medical History:  Diagnosis Date  . Insomnia     Patient Active Problem List   Diagnosis Date Noted  . 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 Brief psychotic disorder ([place]) 07/30/2018  . TBI (traumatic brain injury) ([place]) 09/16/2017  . 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 HAND SURGERY         Family History  Problem Relation Age of Onset  . Healthy Mother     Social History   Tobacco Use  . Smoking status: Former Smoker    Packs/day: 0.50    Types: Cigarettes  .  Smokeless tobacco: Never Used  Substance Use Topics  . Alcohol use: Yes    Comment: occasionally  . Drug use: Not Currently    Types: Marijuana    Home Medications Prior to Admission medications   Medication Sig Start Date End Date Taking? Authorizing Provider  ARIPiprazole (ABILIFY) 20 MG tablet Take 20 mg by mouth daily.    [provider]  propranolol (INDERAL) 20 MG tablet Take 20 mg by mouth 3 (three) times daily.    [provider]  benztropine (COGENTIN) 0.5 MG tablet Take 1 tablet (0.5 mg total) by mouth 2 (two) times daily. 08/01/18 12/18/18  Johnn Hai, MD  fluticasone (FLONASE) 50 MCG/ACT nasal spray Place 1 spray into both nostrils daily. Patient not taking: Reported on 07/30/2018 12/21/17 12/18/18  Loura Halt A, NP  omega-3 acid ethyl esters (LOVAZA) 1 g capsule Take 1 capsule (1 g total) by mouth 2 (two) times daily. 08/01/18 12/18/18  Johnn Hai, MD  risperiDONE (RISPERDAL) 3 MG tablet Take 2 tablets (6 mg total) by mouth at bedtime. 08/01/18 12/18/18  Johnn Hai, MD  traZODone (DESYREL) 150 MG tablet Take 1 tablet (150 mg total) by mouth at bedtime as needed for sleep. 08/01/18 12/18/18  Johnn Hai, MD    Allergies    Patient has no known allergies.  Review of Systems   Review of Systems  Constitutional: Negative for chills and fever.  Respiratory: Positive for cough. Negative for  shortness of breath.   Cardiovascular: Negative for chest pain.  Gastrointestinal: Negative for abdominal pain and nausea.  Musculoskeletal: Negative for back pain.  Neurological: Negative for dizziness and headaches.    Physical Exam Updated Vital Signs BP (!) 149/91 (BP Location: Right Arm)   Pulse (!) 103   Temp 98.1 F (36.7 C) (Oral)   Resp 20   Ht 5\' 8"  (1.727 m)   Wt 76.2 kg   SpO2 96%   BMI 25.54 kg/m   Physical Exam Vitals and nursing note reviewed. Exam conducted with a chaperone present.  Constitutional:      Appearance: Normal appearance.  HENT:      Head: Normocephalic and atraumatic.  Eyes:     General: No scleral icterus.    Conjunctiva/sclera: Conjunctivae normal.  Cardiovascular:     Rate and Rhythm: Normal rate and regular rhythm.     Pulses: Normal pulses.     Heart sounds: Normal heart sounds.  Pulmonary:     Effort: Pulmonary effort is normal. No respiratory distress.     Breath sounds: Normal breath sounds.  Musculoskeletal:     Cervical back: Normal range of motion.  Skin:    General: Skin is dry.     Capillary Refill: Capillary refill takes less than 2 seconds.  Neurological:     Mental Status: He is alert and oriented to person, place, and time.     GCS: GCS eye subscore is 4. GCS verbal subscore is 5. GCS motor subscore is 6.  Psychiatric:        Mood and Affect: Mood normal.        Behavior: Behavior normal.        Thought Content: Thought content normal.      ED Results / Procedures / Treatments   Labs (all labs ordered are listed, but only abnormal results are displayed) Labs Reviewed - No data to display  EKG None  Radiology No results found.  Procedures Procedures (including critical care time)  Medications Ordered in ED Medications - No data to display  ED Course  I have reviewed the triage vital signs and the nursing notes.  Pertinent labs & imaging results that were available during my care of the patient were reviewed by me and considered in my medical decision making (see chart for details).    MDM Rules/Calculators/A&P                      Patient is resting comfortably on my examination and is in no acute distress.  He is denying any fevers or chills, congestion, itchy eyes, or other symptoms concerning for upper respiratory infection or seasonal allergies.  He is only endorsing a mild, intermittent nonproductive cough.  Given his sick contacts however, I feel as though COVID-19 testing is warranted.  Particularly given that is required for him to return to work.  I emphasized the  importance of obtaining a primary care provider for ongoing evaluation and management of his health and wellbeing.  I do not feel as though plain films or laboratory work-up is warranted given his nontoxic appearance and reassuring vital signs.  His heart rate was mildly elevated during triage, but normal during my examination.  He denies any fevers, chills, body aches, or other systemic illness.  Recommended over-the-counter antitussives should his cough become more problematic.  Will obtain send out COVID-19 testing.  James [name] was evaluated in Emergency Department on 07/05/2019 for the symptoms described in  the history of present illness. He was evaluated in the context of the global COVID-19 pandemic, which necessitated consideration that the patient might be at risk for infection with the SARS-CoV-2 virus that causes COVID-19. Institutional protocols and algorithms that pertain to the evaluation of patients at risk for COVID-19 are in a state of rapid change based on information released by regulatory bodies including the CDC and federal and state organizations. These policies and algorithms were followed during the patient's care in the ED.  Final Clinical Impression(s) / ED Diagnoses Final diagnoses:  Exposure to COVID-19 virus    Rx / DC Orders ED Discharge Orders    None       Lorelee New, PA-C 07/05/19 8875    Little, Ambrose Finland, MD 07/06/19 [phone]

## 2019-07-05 NOTE — Discharge Instructions (Addendum)
Please be sure to remain hydrated.  Consider over-the-counter anticough agent such as Delsym should your symptoms of cough become more problematic.  Ibuprofen or Tylenol as needed should you develop any fevers or chills.  Return to the ED or seek immediate medical attention she develop any new or worsening symptoms.  It is important that you get established with a primary care provider that accepts your insurance for ongoing evaluation and management of your health and wellbeing.  If you live with, or provide care at home for, a person confirmed to have, or being evaluated for, COVID-19 infection please follow these guidelines to prevent infection:  Follow healthcare provider's instructions Make sure that you understand and can help the patient follow any healthcare provider instructions for all care.  Provide for the patient's basic needs You should help the patient with basic needs in the home and provide support for getting groceries, prescriptions, and other personal needs.  Monitor the patient's symptoms If they are getting sicker, call his or her medical provider a  This will help the healthcare provider's office take steps to keep other people from getting infected. Ask the healthcare provider to call the local or state health department.  Limit the number of people who have contact with the patient If possible, have only one caregiver for the patient. Other household members should stay in another home or place of residence. If this is not possible, they should stay in another room, or be separated from the patient as much as possible. Use a separate bathroom, if available. Restrict visitors who do not have an essential need to be in the home.  Keep older adults, very young children, and other sick people away from the patient Keep older adults, very young children, and those who have compromised immune systems or chronic health conditions away from the patient. This includes people  with chronic heart, lung, or kidney conditions, diabetes, and cancer.  Ensure good ventilation Make sure that shared spaces in the home have good air flow, such as from an air conditioner or an opened window, weather permitting.  Wash your hands often Wash your hands often and thoroughly with soap and water for at least 20 seconds. You can use an alcohol based hand sanitizer if soap and water are not available and if your hands are not visibly dirty. Avoid touching your eyes, nose, and mouth with unwashed hands. Use disposable paper towels to dry your hands. If not available, use dedicated cloth towels and replace them when they become wet.  Wear a facemask and gloves Wear a disposable facemask at all times in the room and gloves when you touch or have contact with the patient's blood, body fluids, and/or secretions or excretions, such as sweat, saliva, sputum, nasal mucus, vomit, urine, or feces.  Ensure the mask fits over your nose and mouth tightly, and do not touch it during use. Throw out disposable facemasks and gloves after using them. Do not reuse. Wash your hands immediately after removing your facemask and gloves. If your personal clothing becomes contaminated, carefully remove clothing and launder. Wash your hands after handling contaminated clothing. Place all used disposable facemasks, gloves, and other waste in a lined container before disposing them with other household waste. Remove gloves and wash your hands immediately after handling these items.  Do not share dishes, glasses, or other household items with the patient Avoid sharing household items. You should not share dishes, drinking glasses, cups, eating utensils, towels, bedding, or other items After  the person uses these items, you should wash them thoroughly with soap and water.  Wash laundry thoroughly Immediately remove and wash clothes or bedding that have blood, body fluids, and/or secretions or excretions, such as  sweat, saliva, sputum, nasal mucus, vomit, urine, or feces, on them. Wear gloves when handling laundry from the patient. Read and follow directions on labels of laundry or clothing items and detergent. In general, wash and dry with the warmest temperatures recommended on the label.  Clean all areas the individual has used often Clean all touchable surfaces, such as counters, tabletops, doorknobs, bathroom fixtures, toilets, phones, keyboards, tablets, and bedside tables, every day. Also, clean any surfaces that may have blood, body fluids, and/or secretions or excretions on them. Wear gloves when cleaning surfaces the patient has come in contact with. Use a diluted bleach solution (e.g., dilute bleach with 1 part bleach and 10 parts water) or a household disinfectant with a label that says EPA-registered for coronaviruses. To make a bleach solution at home, add 1 tablespoon of bleach to 1 quart (4 cups) of water. For a larger supply, add  cup of bleach to 1 gallon (16 cups) of water. Read labels of cleaning products and follow recommendations provided on product labels. Labels contain instructions for safe and effective use of the cleaning product including precautions you should take when applying the product, such as wearing gloves or eye protection and making sure you have good ventilation during use of the product. Remove gloves and wash hands immediately after cleaning.  Monitor yourself for signs and symptoms of illness Caregivers and household members are considered close contacts, should monitor their health, and will be asked to limit movement outside of the home to the extent possible. Follow the monitoring steps for close contacts listed on the symptom monitoring form.   ? If you have additional questions, contact your local health department or call the epidemiologist on call at [phone] (available 24/7). ? This guidance is subject to change. For the most up-to-date guidance from  [facility], please refer to their website: [email or web address]

## 2019-07-05 NOTE — ED Triage Notes (Signed)
Pt. Stated, I need a COVID test for my job. Ive had a cough since Wed.

## 2019-12-02 ENCOUNTER — Ambulatory Visit: Payer: Self-pay

## 2020-05-13 ENCOUNTER — Other Ambulatory Visit: Payer: Self-pay

## 2020-05-13 ENCOUNTER — Encounter (HOSPITAL_COMMUNITY): Payer: Self-pay

## 2020-05-13 ENCOUNTER — Ambulatory Visit (HOSPITAL_COMMUNITY)
Admission: EM | Admit: 2020-05-13 | Discharge: 2020-05-13 | Disposition: A | Discharge status: Home / Self Care | Payer: BC Managed Care – PPO | Attending: Student | Admitting: Student

## 2020-05-13 DIAGNOSIS — H00011 Hordeolum externum right upper eyelid: Secondary | ICD-10-CM | POA: Diagnosis present

## 2020-05-13 DIAGNOSIS — Z113 Encounter for screening for infections with a predominantly sexual mode of transmission: Secondary | ICD-10-CM | POA: Insufficient documentation

## 2020-05-13 LAB — HIV ANTIBODY (ROUTINE TESTING W REFLEX): HIV Screen 4th Generation wRfx: NONREACTIVE

## 2020-05-13 MED ORDER — ERYTHROMYCIN 5 MG/GM OP OINT: TOPICAL_OINTMENT | OPHTHALMIC | 0 refills | Status: DC

## 2020-05-13 NOTE — Discharge Instructions (Signed)
-  For stye: use the erythromycin ointment nightly for 2 weeks. Continue to do warm compresses and wash your face at least once daily.  -We'll call you if any of your labwork is abnormal.

## 2020-05-13 NOTE — ED Provider Notes (Signed)
[facility]    CSN: [id number] Arrival date & time: 05/13/20  4098      History   Chief Complaint Chief Complaint  Patient presents with  . Eye Drainage    HPI James [name] is a 30 y.o. male presenting for right eye swelling, blurry vision for 1 week. 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 Endorses history of styes. Today states right upper eyelid with mild pain and swelling for 1 week, getting worse. Endorses some crusting in the morning. Pt states blood came out of his eye 1 week ago, but based on presentation today I am not convinced this occurred.  Denies photophobia, foreign body sensation,  eye pain, eye pain with movement, injury to eye, vision changes, double vision, excessive tearing, burning eyes. Does not wear contacts or glasses.   He is also requesting testing for STIs today. Denies symptoms. Denies hematuria, dysuria, frequency, urgency, back pain, n/v/d/abd pain, fevers/chills, abdnormal discharge, penile lesions, etc.     HPI  Past Medical History:  Diagnosis Date  . Insomnia     Patient Active Problem List   Diagnosis Date Noted  . 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 Brief psychotic disorder (HCC) 07/30/2018  . TBI (traumatic brain injury) (HCC) 09/16/2017  . 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 HAND SURGERY         Home Medications    Prior to Admission medications   Medication Sig Start Date End Date Taking? Authorizing Provider  erythromycin ophthalmic ointment Place a 1/2 inch ribbon of ointment into the lower eyelid. 05/13/20  Yes Rhys Martini, PA-C  ARIPiprazole (ABILIFY) 20 MG tablet Take 20 mg by mouth daily.    [provider]  propranolol (INDERAL) 20 MG tablet  Take 20 mg by mouth 3 (three) times daily.    [provider]  benztropine (COGENTIN) 0.5 MG tablet Take 1 tablet (0.5 mg total) by mouth 2 (two) times daily. 08/01/18 12/18/18  Malvin Johns, MD  fluticasone (FLONASE) 50 MCG/ACT nasal spray Place 1 spray into both nostrils daily. Patient not taking: Reported on 07/30/2018 12/21/17 12/18/18  Dahlia Byes A, NP  omega-3 acid ethyl esters (LOVAZA) 1 g capsule Take 1 capsule (1 g total) by mouth 2 (two) times daily. 08/01/18 12/18/18  Malvin Johns, MD  risperiDONE (RISPERDAL) 3 MG tablet Take 2 tablets (6 mg total) by mouth at bedtime. 08/01/18 12/18/18  Malvin Johns, MD  traZODone (DESYREL) 150 MG tablet Take 1 tablet (150 mg total) by mouth at bedtime as needed for sleep. 08/01/18 12/18/18  Malvin Johns, MD    Family History Family History  Problem Relation Age of Onset  . Healthy Mother     Social History Social History   Tobacco Use  . Smoking status: Former Smoker    Packs/day: 0.50    Types: Cigarettes  . Smokeless tobacco: Never Used  Vaping Use  . Vaping Use: Never used  Substance Use Topics  . Alcohol use: Yes    Comment: occasionally  . Drug use: Not Currently    Types: Marijuana     Allergies   Patient has no known allergies.   Review of Systems Review of Systems  Constitutional: Negative for chills and fever.  HENT: Negative for sore throat.   Eyes: Positive for discharge and itching. Negative for photophobia,  pain, redness and visual disturbance.  Respiratory: Negative for shortness of breath.   Cardiovascular: Negative for chest pain.  Gastrointestinal: Negative for abdominal pain, diarrhea, nausea and vomiting.  Genitourinary: Negative for decreased urine volume, difficulty urinating, dysuria, flank pain, frequency, genital sores, hematuria and urgency.  Musculoskeletal: Negative for back pain.  Skin: Negative for rash.     Physical Exam Triage Vital Signs ED Triage Vitals  Enc Vitals Group     BP 05/13/20  1122 (!) 158/83     Pulse Rate 05/13/20 1122 82     Resp --      Temp 05/13/20 1122 98.5 F (36.9 C)     Temp Source 05/13/20 1122 Oral     SpO2 05/13/20 1122 100 %     Weight --      Height --      Head Circumference --      Peak Flow --      Pain Score 05/13/20 1119 0     Pain Loc --      Pain Edu? --      Excl. in GC? --    No data found.  Updated Vital Signs BP (S) (!) 158/83 (BP Location: Right Arm)   Pulse 82   Temp 98.5 F (36.9 C) (Oral)   SpO2 100%   Visual Acuity Right Eye Distance: 20/25 (Without correction ) Left Eye Distance: 20/25 (Without correction ) Bilateral Distance: 20/20 (Without correction )  Right Eye Near:   Left Eye Near:    Bilateral Near:     Physical Exam Vitals reviewed.  Constitutional:      Appearance: Normal appearance.  Eyes:     General: Lids are everted, no foreign bodies appreciated. Vision grossly intact. Gaze aligned appropriately. No allergic shiner, visual field deficit or scleral icterus.       Right eye: Discharge and hordeolum present. No foreign body.        Left eye: No foreign body, discharge or hordeolum.     Extraocular Movements: Extraocular movements intact.     Conjunctiva/sclera:     Right eye: Right conjunctiva is injected. Exudate present. No chemosis or hemorrhage.    Left eye: Left conjunctiva is not injected. No chemosis, exudate or hemorrhage.    Pupils: Pupils are equal, round, and reactive to light.     Visual Fields: Right eye visual fields normal and left eye visual fields normal.     Comments: R eye scant injection, scant yellow discharge. Small hordeolum of upper inner lid.   Cardiovascular:     Rate and Rhythm: Normal rate and regular rhythm.     Heart sounds: Normal heart sounds.  Pulmonary:     Effort: Pulmonary effort is normal.     Breath sounds: Normal breath sounds.  Neurological:     General: No focal deficit present.     Mental Status: He is alert and oriented to person, place, and time.   Psychiatric:        Mood and Affect: Mood normal.        Behavior: Behavior normal.        Thought Content: Thought content normal.        Judgment: Judgment normal.      UC Treatments / Results  Labs (all labs ordered are listed, but only abnormal results are displayed) Labs Reviewed  RPR  HIV ANTIBODY (ROUTINE TESTING W REFLEX)  CYTOLOGY, (ORAL, ANAL, URETHRAL) ANCILLARY ONLY    EKG   Radiology No results  found.  Procedures Procedures (including critical care time)  Medications Ordered in UC Medications - No data to display  Initial Impression / Assessment and Plan / UC Course  I have reviewed the triage vital signs and the nursing notes.  Pertinent labs & imaging results that were available during my care of the patient were reviewed by me and considered in my medical decision making (see chart for details).     Vision intact today: he does not wear glasses/contacts Right Eye Distance: 20/25 (Without correction ) Left Eye Distance: 20/25 (Without correction ) Bilateral Distance: 20/20 (Without correction )  For stye, erythromycin ointment as below. Continue warm compresses. F/u if symptoms worsen instead of improve; vision changes; etc.   Pt also requests screening for STI but denies symptoms. Will send for G/C, trich, yeast, BV testing, HIV, RPR.   We have sent testing for sexually transmitted infections. We will notify you of any positive results once they are received. If required, we will prescribe any medications you might need.   Please refrain from all sexual activity for at least the next seven days.  Seek additional medical attention if you develop fevers/chills, new/worsening abdominal pain, new/worsening vaginal discomfort/discharge, etc. Patient verbalizes understanding and agreement.   Final Clinical Impressions(s) / UC Diagnoses   Final diagnoses:  Hordeolum externum of right upper eyelid  Routine screening for STI (sexually transmitted  infection)     Discharge Instructions     -For stye: use the erythromycin ointment nightly for 2 weeks. Continue to do warm compresses and wash your face at least once daily.  -We'll call you if any of your labwork is abnormal.    ED Prescriptions    Medication Sig Dispense Auth. Provider   erythromycin ophthalmic ointment Place a 1/2 inch ribbon of ointment into the lower eyelid. 3.5 g Rhys Martini, PA-C     PDMP not reviewed this encounter.   Rhys Martini, PA-C 05/13/20 1149

## 2020-05-13 NOTE — ED Triage Notes (Signed)
Patient presents to Urgent Care with complaints of right eye swelling and blurry vision since last Weds. Patient reports he noted some bleeding when cleaning eye last weds. Pt states applying warm compress to right eye. Pt requesting HIV/std testing as well.

## 2020-05-14 ENCOUNTER — Telehealth (HOSPITAL_COMMUNITY): Payer: Self-pay | Admitting: Emergency Medicine

## 2020-05-14 LAB — CYTOLOGY, (ORAL, ANAL, URETHRAL) ANCILLARY ONLY
Chlamydia: NEGATIVE
Comment: NEGATIVE
Comment: NEGATIVE
Comment: NORMAL
Neisseria Gonorrhea: NEGATIVE
Trichomonas: POSITIVE — AB

## 2020-05-14 LAB — RPR: RPR Ser Ql: NONREACTIVE

## 2020-05-14 MED ORDER — METRONIDAZOLE 500 MG PO TABS: 500.0000 mg | ORAL_TABLET | Freq: Two times a day (BID) | ORAL | 0 refills | Status: DC

## 2020-05-29 ENCOUNTER — Encounter (HOSPITAL_COMMUNITY): Payer: Self-pay

## 2020-05-29 ENCOUNTER — Ambulatory Visit (HOSPITAL_COMMUNITY)
Admission: EM | Admit: 2020-05-29 | Discharge: 2020-05-29 | Disposition: A | Discharge status: Home / Self Care | Payer: BC Managed Care – PPO | Attending: Urgent Care | Admitting: Urgent Care

## 2020-05-29 DIAGNOSIS — Z87891 Personal history of nicotine dependence: Secondary | ICD-10-CM | POA: Diagnosis not present

## 2020-05-29 DIAGNOSIS — Z79899 Other long term (current) drug therapy: Secondary | ICD-10-CM | POA: Insufficient documentation

## 2020-05-29 DIAGNOSIS — A599 Trichomoniasis, unspecified: Secondary | ICD-10-CM | POA: Diagnosis not present

## 2020-05-29 DIAGNOSIS — Z09 Encounter for follow-up examination after completed treatment for conditions other than malignant neoplasm: Secondary | ICD-10-CM | POA: Diagnosis present

## 2020-05-29 DIAGNOSIS — Z8619 Personal history of other infectious and parasitic diseases: Secondary | ICD-10-CM | POA: Insufficient documentation

## 2020-05-29 DIAGNOSIS — Z113 Encounter for screening for infections with a predominantly sexual mode of transmission: Secondary | ICD-10-CM | POA: Insufficient documentation

## 2020-05-29 NOTE — ED Triage Notes (Signed)
Pt present recheck for any STD, pt state that he is not having any symptoms and he completed his flagyl for the positive STI.

## 2020-05-29 NOTE — ED Provider Notes (Signed)
[facility]   MRN: [id number] DOB: February 18, 1991  Subjective:   James [name] is a 30 y.o. male presenting for recheck for trichomoniasis. Patient completed his treatment and wants to make sure he cleared the infections. Denies dysuria, hematuria, urinary frequency, penile discharge, penile swelling, testicular pain, testicular swelling, anal pain, groin pain.   No current facility-administered medications for this encounter.  Current Outpatient Medications:  .  ARIPiprazole (ABILIFY) 20 MG tablet, Take 20 mg by mouth daily., Disp: , Rfl:  .  erythromycin ophthalmic ointment, Place a 1/2 inch ribbon of ointment into the lower eyelid., Disp: 3.5 g, Rfl: 0 .  metroNIDAZOLE (FLAGYL) 500 MG tablet, Take 1 tablet (500 mg total) by mouth 2 (two) times daily., Disp: 14 tablet, Rfl: 0 .  propranolol (INDERAL) 20 MG tablet, Take 20 mg by mouth 3 (three) times daily., Disp: , Rfl:    No Known Allergies  Past Medical History:  Diagnosis Date  . Insomnia      Past Surgical History:  Procedure Laterality Date  . HAND SURGERY      Family History  Problem Relation Age of Onset  . Healthy Mother     Social History   Tobacco Use  . Smoking status: Former Smoker    Packs/day: 0.50    Types: Cigarettes  . Smokeless tobacco: Never Used  Vaping Use  . Vaping Use: Never used  Substance Use Topics  . Alcohol use: Yes    Comment: occasionally  . Drug use: Not Currently    Types: Marijuana    ROS   Objective:   Vitals: BP (!) 141/91 (BP Location: Right Arm)   Pulse 80   Temp 98.5 F (36.9 C) (Oral)   Resp 18   SpO2 97%   Physical Exam Constitutional:      General: He is not in acute distress.    Appearance: Normal appearance. He is well-developed and normal weight. He is not ill-appearing, toxic-appearing or diaphoretic.  HENT:     Head: Normocephalic and atraumatic.     Right Ear: External ear normal.     Left Ear: External ear normal.     Nose: Nose  normal.     Mouth/Throat:     Pharynx: Oropharynx is clear.  Eyes:     General: No scleral icterus.       Right eye: No discharge.        Left eye: No discharge.     Extraocular Movements: Extraocular movements intact.     Pupils: Pupils are equal, round, and reactive to light.  Cardiovascular:     Rate and Rhythm: Normal rate.  Pulmonary:     Effort: Pulmonary effort is normal.  Musculoskeletal:     Cervical back: Normal range of motion.  Neurological:     Mental Status: He is alert and oriented to person, place, and time.  Psychiatric:        Mood and Affect: Mood normal.        Behavior: Behavior normal.        Thought Content: Thought content normal.        Judgment: Judgment normal.      Assessment and Plan :   PDMP not reviewed this encounter.  1. Trichomoniasis     Counseled against rechecking which is not recommended unless patient is higher risk. However, I obliged patient and will do the recheck as he wants to make sure he cleared the infection. Will treat patient based  off of lab results.    Wallis Bamberg, PA-C 05/29/20 1137

## 2020-05-31 LAB — CYTOLOGY, (ORAL, ANAL, URETHRAL) ANCILLARY ONLY
Chlamydia: NEGATIVE
Comment: NEGATIVE
Comment: NEGATIVE
Comment: NORMAL
Neisseria Gonorrhea: NEGATIVE
Trichomonas: NEGATIVE

## 2020-12-23 ENCOUNTER — Encounter (HOSPITAL_COMMUNITY): Payer: Self-pay

## 2020-12-23 ENCOUNTER — Ambulatory Visit (HOSPITAL_COMMUNITY)
Admission: EM | Admit: 2020-12-23 | Discharge: 2020-12-23 | Disposition: A | Discharge status: Home / Self Care | Payer: BC Managed Care – PPO | Attending: Medical Oncology | Admitting: Medical Oncology

## 2020-12-23 ENCOUNTER — Other Ambulatory Visit: Payer: Self-pay

## 2020-12-23 DIAGNOSIS — Z113 Encounter for screening for infections with a predominantly sexual mode of transmission: Secondary | ICD-10-CM | POA: Diagnosis not present

## 2020-12-23 NOTE — ED Provider Notes (Signed)
[facility]    CSN: [id number] Arrival date & time: 12/23/20  0944      History   Chief Complaint Chief Complaint  Patient presents with   std testing    HPI James [name] is a 30 y.o. male.   HPI  STI Testing: Patient here for routine STI screening.  He has a history of trichomonas but this has been successfully treating according to patient.  He currently denies any penile discharge, dysuria, testicular pain.  No known exposures.   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 Authorizing Provider  ARIPiprazole (ABILIFY) 20 MG tablet Take 20 mg by mouth daily.    [provider]  erythromycin ophthalmic ointment Place a 1/2 inch ribbon of ointment into the lower eyelid. 05/13/20   Rhys Martini, PA-C  metroNIDAZOLE (FLAGYL) 500 MG tablet Take 1 tablet (500 mg total) by mouth 2 (two) times daily. 05/14/20   Merrilee Jansky, MD  propranolol (INDERAL) 20 MG tablet Take 20 mg by mouth 3 (three) times daily.    [provider]  benztropine (COGENTIN) 0.5 MG tablet Take 1 tablet (0.5 mg total) by mouth 2 (two) times daily. 08/01/18 12/18/18  Malvin Johns, MD  fluticasone (FLONASE) 50 MCG/ACT nasal spray Place 1 spray into both nostrils daily. Patient not taking: Reported on 07/30/2018 12/21/17 12/18/18  Dahlia Byes A, NP  omega-3 acid ethyl esters (LOVAZA) 1 g capsule Take 1 capsule (1 g total) by mouth 2 (two) times daily. 08/01/18 12/18/18  Malvin Johns, MD  risperiDONE (RISPERDAL)  3 MG tablet Take 2 tablets (6 mg total) by mouth at bedtime. 08/01/18 12/18/18  Malvin Johns, MD  traZODone (DESYREL) 150 MG tablet Take 1 tablet (150 mg total) by mouth at bedtime as needed for sleep. 08/01/18 12/18/18  Malvin Johns, MD    Family History Family History  Problem Relation Age of Onset   Healthy Mother     Social History Social History   Tobacco Use   Smoking status: Former    Packs/day: 0.50    Types: Cigarettes   Smokeless tobacco: Never  Vaping Use   Vaping Use: Never used  Substance Use Topics   Alcohol use: Yes    Comment: occasionally   Drug use: Not Currently    Types: Marijuana     Allergies   Patient has no known allergies.   Review of Systems Review of Systems  As stated above in HPI Physical Exam Triage Vital Signs ED Triage Vitals  Enc Vitals Group     BP 12/23/20 1122 (!) 122/101     Pulse Rate 12/23/20 1122 62     Resp 12/23/20 1122 19     Temp --      Temp src --      SpO2 12/23/20 1122 97 %     Weight --      Height --      Head Circumference --      Peak Flow --  Pain Score 12/23/20 1121 0     Pain Loc --      Pain Edu? --      Excl. in GC? --    No data found.  Updated Vital Signs BP (!) 122/101 (BP Location: Right Arm)   Pulse 62   Resp 19   SpO2 97%   Physical Exam Vitals and nursing note reviewed.  Constitutional:      General: He is not in acute distress.    Appearance: Normal appearance. He is not ill-appearing, toxic-appearing or diaphoretic.  Genitourinary:    Comments: Pt to obtain self swab collection Skin:    Findings: No rash.  Neurological:     Mental Status: He is alert.     UC Treatments / Results  Labs (all labs ordered are listed, but only abnormal results are displayed) Labs Reviewed - No data to display  EKG   Radiology No results found.  Procedures Procedures (including critical care time)  Medications Ordered in UC Medications - No data to display  Initial Impression /  Assessment and Plan / UC Course  I have reviewed the triage vital signs and the nursing notes.  Pertinent labs & imaging results that were available during my care of the patient were reviewed by me and considered in my medical decision making (see chart for details).     New. STI screenings pending. Safe sex handout given and discussed. Follow up PRN.  Final Clinical Impressions(s) / UC Diagnoses   Final diagnoses:  None   Discharge Instructions   None    ED Prescriptions   None    PDMP not reviewed this encounter.   Rushie Chestnut, [place] 12/23/20 1210

## 2020-12-23 NOTE — ED Triage Notes (Signed)
Pt in for routine std screening  Denies any sx

## 2020-12-24 ENCOUNTER — Telehealth (HOSPITAL_COMMUNITY): Payer: Self-pay | Admitting: Emergency Medicine

## 2020-12-24 LAB — CYTOLOGY, (ORAL, ANAL, URETHRAL) ANCILLARY ONLY
Chlamydia: NEGATIVE
Comment: NEGATIVE
Comment: NEGATIVE
Comment: NORMAL
Neisseria Gonorrhea: NEGATIVE
Trichomonas: POSITIVE — AB

## 2020-12-24 MED ORDER — METRONIDAZOLE 500 MG PO TABS: 2000.0000 mg | ORAL_TABLET | Freq: Once | ORAL | 0 refills | Status: AC

## 2020-12-26 ENCOUNTER — Telehealth (HOSPITAL_COMMUNITY): Payer: Self-pay | Admitting: *Deleted

## 2021-01-06 ENCOUNTER — Other Ambulatory Visit: Payer: Self-pay

## 2021-01-06 ENCOUNTER — Encounter (HOSPITAL_COMMUNITY): Payer: Self-pay

## 2021-01-06 ENCOUNTER — Ambulatory Visit (HOSPITAL_COMMUNITY)
Admission: RE | Admit: 2021-01-06 | Discharge: 2021-01-06 | Disposition: A | Discharge status: Home / Self Care | Payer: BC Managed Care – PPO | Source: Ambulatory Visit | Attending: Family Medicine | Admitting: Family Medicine

## 2021-01-06 VITALS — BP 142/84 | HR 73 | Temp 97.9°F | Resp 19

## 2021-01-06 DIAGNOSIS — L290 Pruritus ani: Secondary | ICD-10-CM | POA: Diagnosis not present

## 2021-01-06 DIAGNOSIS — K649 Unspecified hemorrhoids: Secondary | ICD-10-CM | POA: Diagnosis not present

## 2021-01-06 LAB — HIV ANTIBODY (ROUTINE TESTING W REFLEX): HIV Screen 4th Generation wRfx: NONREACTIVE

## 2021-01-06 MED ORDER — HYDROCORTISONE ACETATE 25 MG RE SUPP: 25.0000 mg | Freq: Two times a day (BID) | RECTAL | 1 refills | Status: DC | PRN

## 2021-01-06 NOTE — ED Triage Notes (Signed)
Pt tested positive for trichomonas at the beginning of the month. Patient was treated with flagyl and would like a recheck for everything including hiv. Pt reports itching in his anal area when using the bathroom, denies anal sexual intercourse.

## 2021-01-06 NOTE — ED Provider Notes (Signed)
[facility]    CSN: [id number] Arrival date & time: 01/06/21  0858      History   Chief Complaint Chief Complaint  Patient presents with   SEXUALLY TRANSMITTED DISEASE    HPI James [name] is a 30 y.o. male.   Patient presenting today requesting a full panel of STD screening.  Tested positive for trichomonas last month and completed full course of Flagyl.  Not currently having any penile discharge, itching, rashes, dysuria, hematuria, frequency, pelvic or abdominal pain.  No new exposures that he is aware of.  Has been having anal itching and inflammation, sometimes some spotting with wiping after bowel movements.  Thinks he may have hemorrhoids.  Does sometimes strain for bowel movements.  Has not been trying anything over-the-counter for this.  No pain with bowel movements, significant bleeding episodes, abdominal pain.  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 Authorizing Provider  hydrocortisone (ANUSOL-HC) 25 MG suppository Place 1 suppository (25 mg total) rectally 2 (two) times daily as needed for hemorrhoids or anal itching. 01/06/21  Yes Particia Nearing, PA-C  ARIPiprazole (ABILIFY) 20 MG tablet Take 20 mg by mouth daily.    [provider]  erythromycin ophthalmic ointment Place a 1/2 inch ribbon of ointment into the lower eyelid. 05/13/20   Rhys Martini, PA-C  propranolol (INDERAL) 20 MG tablet Take 20 mg by mouth 3 (three) times daily.    [provider]   benztropine (COGENTIN) 0.5 MG tablet Take 1 tablet (0.5 mg total) by mouth 2 (two) times daily. 08/01/18 12/18/18  Malvin Johns, MD  fluticasone (FLONASE) 50 MCG/ACT nasal spray Place 1 spray into both nostrils daily. Patient not taking: Reported on 07/30/2018 12/21/17 12/18/18  Dahlia Byes A, NP  omega-3 acid ethyl esters (LOVAZA) 1 g capsule Take 1 capsule (1 g total) by mouth 2 (two) times daily. 08/01/18 12/18/18  Malvin Johns, MD  risperiDONE (RISPERDAL) 3 MG tablet Take 2 tablets (6 mg total) by mouth at bedtime. 08/01/18 12/18/18  Malvin Johns, MD  traZODone (DESYREL) 150 MG tablet Take 1 tablet (150 mg total) by mouth at bedtime as needed for sleep. 08/01/18 12/18/18  Malvin Johns, MD    Family History Family History  Problem Relation Age of Onset   Healthy Mother     Social History Social History   Tobacco Use   Smoking status: Former    Packs/day: 0.50    Types: Cigarettes   Smokeless tobacco: Never  Vaping Use   Vaping Use: Never used  Substance Use Topics   Alcohol use: Yes    Comment: occasionally   Drug use: Not Currently    Types: Marijuana     Allergies   Patient has no known allergies.   Review of Systems Review of Systems Per HPI  Physical Exam Triage Vital Signs ED Triage Vitals  Enc Vitals Group     BP 01/06/21 0929 (!) 142/84     Pulse Rate 01/06/21 0929 73  Resp 01/06/21 0929 19     Temp 01/06/21 0929 97.9 F (36.6 C)     Temp src --      SpO2 01/06/21 0929 98 %     Weight --      Height --      Head Circumference --      Peak Flow --      Pain Score 01/06/21 0928 0     Pain Loc --      Pain Edu? --      Excl. in GC? --    No data found.  Updated Vital Signs BP (!) 142/84   Pulse 73   Temp 97.9 F (36.6 C)   Resp 19   SpO2 98%   Visual Acuity Right Eye Distance:   Left Eye Distance:   Bilateral Distance:    Right Eye Near:   Left Eye Near:    Bilateral Near:     Physical Exam Vitals and nursing note reviewed.  Constitutional:       Appearance: Normal appearance.  HENT:     Head: Atraumatic.     Mouth/Throat:     Mouth: Mucous membranes are moist.  Eyes:     Extraocular Movements: Extraocular movements intact.     Conjunctiva/sclera: Conjunctivae normal.  Cardiovascular:     Rate and Rhythm: Normal rate and regular rhythm.  Pulmonary:     Effort: Pulmonary effort is normal.     Breath sounds: Normal breath sounds.  Abdominal:     General: Bowel sounds are normal. There is no distension.     Palpations: Abdomen is soft.     Tenderness: There is no abdominal tenderness. There is no right CVA tenderness, left CVA tenderness or guarding.  Genitourinary:    Comments: Declines GU exam today. Musculoskeletal:        General: Normal range of motion.     Cervical back: Normal range of motion and neck supple.  Skin:    General: Skin is warm and dry.  Neurological:     General: No focal deficit present.     Mental Status: He is oriented to person, place, and time.  Psychiatric:        Mood and Affect: Mood normal.        Thought Content: Thought content normal.        Judgment: Judgment normal.     UC Treatments / Results  Labs (all labs ordered are listed, but only abnormal results are displayed) Labs Reviewed  RPR  HIV ANTIBODY (ROUTINE TESTING W REFLEX)  CYTOLOGY, (ORAL, ANAL, URETHRAL) ANCILLARY ONLY    EKG   Radiology No results found.  Procedures Procedures (including critical care time)  Medications Ordered in UC Medications - No data to display  Initial Impression / Assessment and Plan / UC Course  I have reviewed the triage vital signs and the nursing notes.  Pertinent labs & imaging results that were available during my care of the patient were reviewed by me and considered in my medical decision making (see chart for details).     Full panel of STD screening pending.  Discussed abstinence until results return, will treat based on results.  Discussed safe sexual practices.  Will  trial Anusol suppositories for suspected hemorrhoids.  Discussed sitz bath, Tucks wipes, fiber supplements, increased fluid intake.  Follow-up if worsening or not resolving.  Final Clinical Impressions(s) / UC Diagnoses   Final diagnoses:  Hemorrhoids, unspecified hemorrhoid type  Anal itching  Discharge Instructions   None    ED Prescriptions     Medication Sig Dispense Auth. Provider   hydrocortisone (ANUSOL-HC) 25 MG suppository Place 1 suppository (25 mg total) rectally 2 (two) times daily as needed for hemorrhoids or anal itching. 30 suppository Particia Nearing, [place]      PDMP not reviewed this encounter.   Particia Nearing, [place] 01/06/21 1015

## 2021-01-07 LAB — RPR: RPR Ser Ql: NONREACTIVE

## 2021-01-07 LAB — CYTOLOGY, (ORAL, ANAL, URETHRAL) ANCILLARY ONLY
Chlamydia: NEGATIVE
Comment: NEGATIVE
Comment: NEGATIVE
Comment: NORMAL
Neisseria Gonorrhea: NEGATIVE
Trichomonas: NEGATIVE

## 2021-07-23 ENCOUNTER — Encounter (HOSPITAL_COMMUNITY): Payer: Self-pay | Admitting: Emergency Medicine

## 2021-07-23 ENCOUNTER — Emergency Department (HOSPITAL_COMMUNITY): Payer: Self-pay

## 2021-07-23 ENCOUNTER — Other Ambulatory Visit: Payer: Self-pay

## 2021-07-23 ENCOUNTER — Emergency Department (HOSPITAL_COMMUNITY)
Admission: EM | Admit: 2021-07-23 | Discharge: 2021-07-24 | Disposition: A | Discharge status: Other Acute Inpatient Hospital | Payer: Self-pay | Attending: Emergency Medicine | Admitting: Emergency Medicine

## 2021-07-23 DIAGNOSIS — R519 Headache, unspecified: Secondary | ICD-10-CM | POA: Insufficient documentation

## 2021-07-23 DIAGNOSIS — F22 Delusional disorders: Secondary | ICD-10-CM | POA: Insufficient documentation

## 2021-07-23 DIAGNOSIS — Z20822 Contact with and (suspected) exposure to covid-19: Secondary | ICD-10-CM | POA: Insufficient documentation

## 2021-07-23 DIAGNOSIS — F2081 Schizophreniform disorder: Secondary | ICD-10-CM | POA: Insufficient documentation

## 2021-07-23 HISTORY — DX: Traumatic subdural hemorrhage with loss of consciousness status unknown, initial encounter: S06.5XAA

## 2021-07-23 LAB — RAPID URINE DRUG SCREEN, HOSP PERFORMED
Amphetamines: NOT DETECTED
Barbiturates: NOT DETECTED
Benzodiazepines: NOT DETECTED
Cocaine: NOT DETECTED
Opiates: NOT DETECTED
Tetrahydrocannabinol: NOT DETECTED

## 2021-07-23 LAB — COMPREHENSIVE METABOLIC PANEL
ALT: 23 U/L (ref 0–44)
AST: 20 U/L (ref 15–41)
Albumin: 4 g/dL (ref 3.5–5.0)
Alkaline Phosphatase: 47 U/L (ref 38–126)
Anion gap: 8 (ref 5–15)
BUN: 10 mg/dL (ref 6–20)
CO2: 23 mmol/L (ref 22–32)
Calcium: 9 mg/dL (ref 8.9–10.3)
Chloride: 108 mmol/L (ref 98–111)
Creatinine, Ser: 1.09 mg/dL (ref 0.61–1.24)
GFR, Estimated: >60 mL/min (ref >60)
Glucose, Bld: 121 mg/dL — ABNORMAL HIGH (ref 70–99)
Potassium: 3.4 mmol/L — ABNORMAL LOW (ref 3.5–5.1)
Sodium: 139 mmol/L (ref 135–145)
Total Bilirubin: 0.3 mg/dL (ref 0.3–1.2)
Total Protein: 7.3 g/dL (ref 6.5–8.1)

## 2021-07-23 LAB — CBC WITH DIFFERENTIAL/PLATELET
Abs Immature Granulocytes: 0.02 10*3/uL (ref 0.00–0.07)
Basophils Absolute: 0 10*3/uL (ref 0.0–0.1)
Basophils Relative: 0 %
Eosinophils Absolute: 0 10*3/uL (ref 0.0–0.5)
Eosinophils Relative: 1 %
HCT: 45.6 % (ref 39.0–52.0)
Hemoglobin: 14.9 g/dL (ref 13.0–17.0)
Immature Granulocytes: 0 %
Lymphocytes Relative: 32 %
Lymphs Abs: 1.7 10*3/uL (ref 0.7–4.0)
MCH: 27.1 pg (ref 26.0–34.0)
MCHC: 32.7 g/dL (ref 30.0–36.0)
MCV: 83.1 fL (ref 80.0–100.0)
Monocytes Absolute: 0.4 10*3/uL (ref 0.1–1.0)
Monocytes Relative: 7 %
Neutro Abs: 3.1 10*3/uL (ref 1.7–7.7)
Neutrophils Relative %: 60 %
Platelets: 298 10*3/uL (ref 150–400)
RBC: 5.49 MIL/uL (ref 4.22–5.81)
RDW: 12.9 % (ref 11.5–15.5)
WBC: 5.2 10*3/uL (ref 4.0–10.5)
nRBC: 0 % (ref 0.0–0.2)

## 2021-07-23 LAB — SALICYLATE LEVEL: Salicylate Lvl: <7 mg/dL — ABNORMAL LOW (ref 7.0–30.0)

## 2021-07-23 LAB — RESP PANEL BY RT-PCR (FLU A&B, COVID) ARPGX2
Influenza A by PCR: NEGATIVE
Influenza B by PCR: NEGATIVE
SARS Coronavirus 2 by RT PCR: NEGATIVE

## 2021-07-23 LAB — ACETAMINOPHEN LEVEL: Acetaminophen (Tylenol), Serum: <10 ug/mL — ABNORMAL LOW (ref 10–30)

## 2021-07-23 LAB — ETHANOL: Alcohol, Ethyl (B): 42 mg/dL — ABNORMAL HIGH (ref <10)

## 2021-07-23 MED ORDER — GABAPENTIN 100 MG PO CAPS
200.0000 mg | ORAL_CAPSULE | Freq: Three times a day (TID) | ORAL | Status: DC
  Administered 2021-07-23: 200 mg via ORAL
  Filled 2021-07-23: qty 2

## 2021-07-23 MED ORDER — STERILE WATER FOR INJECTION IJ SOLN
INTRAMUSCULAR | Status: AC
Start: 2021-07-23 — End: 2021-07-24
  Filled 2021-07-23: qty 10

## 2021-07-23 MED ORDER — ZIPRASIDONE MESYLATE 20 MG IM SOLR
20.0000 mg | Freq: Two times a day (BID) | INTRAMUSCULAR | Status: DC | PRN
Start: 2021-07-23 — End: 2021-07-24
  Administered 2021-07-23: 20 mg via INTRAMUSCULAR
  Filled 2021-07-23: qty 20

## 2021-07-23 MED ORDER — ALUM & MAG HYDROXIDE-SIMETH 200-200-20 MG/5ML PO SUSP: 30.0000 mL | Freq: Four times a day (QID) | ORAL | Status: DC | PRN

## 2021-07-23 MED ORDER — POTASSIUM CHLORIDE CRYS ER 20 MEQ PO TBCR
40.0000 meq | EXTENDED_RELEASE_TABLET | Freq: Once | ORAL | Status: AC
  Administered 2021-07-23: 40 meq via ORAL
  Filled 2021-07-23: qty 2

## 2021-07-23 MED ORDER — IBUPROFEN 400 MG PO TABS
600.0000 mg | ORAL_TABLET | Freq: Once | ORAL | Status: AC
  Administered 2021-07-23: 600 mg via ORAL
  Filled 2021-07-23: qty 1

## 2021-07-23 MED ORDER — ARIPIPRAZOLE 10 MG PO TABS
20.0000 mg | ORAL_TABLET | Freq: Every day | ORAL | Status: DC
  Administered 2021-07-23: 20 mg via ORAL
  Filled 2021-07-23: qty 2

## 2021-07-23 MED ORDER — IBUPROFEN 400 MG PO TABS: 600.0000 mg | ORAL_TABLET | Freq: Three times a day (TID) | ORAL | Status: DC | PRN

## 2021-07-23 MED ORDER — ARIPIPRAZOLE 10 MG PO TABS: 20.0000 mg | ORAL_TABLET | Freq: Every day | ORAL | Status: DC

## 2021-07-23 MED ORDER — PROPRANOLOL HCL 20 MG PO TABS: 20.0000 mg | ORAL_TABLET | Freq: Three times a day (TID) | ORAL | Status: DC

## 2021-07-23 MED ORDER — LORAZEPAM 2 MG/ML IJ SOLN
1.0000 mg | Freq: Two times a day (BID) | INTRAMUSCULAR | Status: DC | PRN
  Administered 2021-07-23: 1 mg via INTRAMUSCULAR
  Filled 2021-07-23: qty 1

## 2021-07-23 NOTE — ED Notes (Signed)
Report handed off to Chris RN. ?

## 2021-07-23 NOTE — Progress Notes (Signed)
Per Vivien Presto, patient meets criteria for inpatient treatment. There are no available or appropriate beds at [facility] today. CSW faxed referrals to the following facilities for review: ? ?[facility]  Pending - Request Sent N/A [address] [place] [address] [phone] --  ?[facility]  Pending - Request Sent N/A [address] [place] [address] [phone] --  ?[facility]  Pending - Request Sent N/A [address] [place] [address] [phone] --  ?[facility]  Pending - Request Sent N/A [address] [place] [address] [phone] --  ?[facility]  Pending - Request Sent N/A [address] [place] [address] [phone] --  ?[facility]  Pending - Request Sent N/A [address] [place], [place] [address] [phone] --  ?[facility]  Pending - Request Sent N/A [address] [place] [address] [phone] --  ?[facility]  Pending - Request Sent N/A [address] [place] [address] [phone] --  ?[facility]  Pending - Request Sent N/A [address] [place] [address] [phone] --  ?[facility]  Pending - Request Sent N/A [address] [place] [address] [phone] --  ?[facility]  Pending - Request Sent N/A [address], [place] [address] [phone] --  ?[facility]  Pending - Request Sent N/A [address] [place] [address] [phone] --  ?[facility]  Pending - Request Sent N/A [name] [place] [address] [phone] --  ?[facility]  Pending - Request  Sent N/A [address] [place] [address] [phone] --  ?[facility]  Pending - Request Sent N/A [address] [place] [address] [phone] --  ? ?TTS will continue to seek bed placement. ? ?Crissie Reese, MSW, LCSW-A, LCAS-A ?Phone: [phone] ?Disposition/TOC ? ?

## 2021-07-23 NOTE — ED Notes (Signed)
Patient standing in the doorway of his room, denies any needs when asked.  ?

## 2021-07-23 NOTE — Progress Notes (Signed)
Patient made two phone calls he said to his Mother who he insist was waiting to pick him up. Patient still is saying he has been discharged and is not here for mental health help. After this NT/Sitter told patient that he could make no more phone calls until 8 am tomorrow, patient said "fuck this" and walked off of [facility] in an attempt to leave hospital. While this NT, Security, [employer], and other staff had to speak with patient for 15 minutes before convincing patient to come back to room. Since shift change when I picked up this patient at 7pm, he has been pacing, anxious, talking without listening and making demands. He is currently speaking to [facility] Officer in his room and watching television. ?

## 2021-07-23 NOTE — Progress Notes (Signed)
Per James [name], pt has been accepted to [facility] [place] unit. Accepting provider is Dr. Ruffin Frederick. Patient can arrive anytime. Number for report is [phone]. ? ? ?Crissie Reese, MSW, LCSW-A ?Phone: [phone] ?Disposition/TOC ? ? ?

## 2021-07-23 NOTE — ED Notes (Signed)
Patient on TTS 

## 2021-07-23 NOTE — ED Notes (Signed)
Patient has a bed at [facility]. Patient refuses to sign voluntary paperwork, Dr. Doren Custard signing IVC paperwork. ?

## 2021-07-23 NOTE — ED Notes (Signed)
Patient making 1st phone call for the day. ?

## 2021-07-23 NOTE — ED Notes (Signed)
Pt pacing around and asking to use the phone. Allowed the pt one phone call. Pt mumbling and walking towards the door. Asked pt to go to room. Pt left purple pod. Security and sitter went after pt. Pt came back with security and was agitated. Consulted MD. Administered prn meds. Pt tolerated well ?

## 2021-07-23 NOTE — ED Notes (Addendum)
Explained to patient that he was being IVC'd and will be going to [facility]. Patient upset and called his mother, I explained the same to his mother. She was upset that he was being sent so far away. Tried to explain that we had to accept whatever beds were offered due to shortage of mental health facilities. Provided her with the main number for the facility. ?

## 2021-07-23 NOTE — ED Notes (Signed)
Belongings placed in locker # 7
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
## 2021-07-23 NOTE — ED Provider Notes (Signed)
?[facility] EMERGENCY DEPARTMENT ?Provider Note ? ? ?CSN: [id number] ?Arrival date & time: 07/23/21  0113 ? ?  ? ?History ? ?Chief Complaint  ?Patient presents with  ? Headache  ? ? ?James [name] is a 31 y.o. male. ? ? ?Headache ?He has history schizophreniform disorder and comes in complaining of a bitemporal headache since about 11 PM.  He also describes that he has been having conversation with the walls and has been having visual hallucinations.  He states that he is able to read other peoples minds and he knows that there are people who are trying to hurt him.  He denies any homicidal or suicidal ideation.  He denies any recent drug use or ethanol use.  He does admit to having had psychiatric problems in the past. ?  ?Home Medications ?Prior to Admission medications   ?Medication Sig Start Date End Date Taking? Authorizing Provider  ?ARIPiprazole (ABILIFY) 20 MG tablet Take 20 mg by mouth daily.    [provider]  ?propranolol (INDERAL) 20 MG tablet Take 20 mg by mouth 3 (three) times daily.    [provider]  ?benztropine (COGENTIN) 0.5 MG tablet Take 1 tablet (0.5 mg total) by mouth 2 (two) times daily. 08/01/18 12/18/18  Malvin Johns, MD  ?fluticasone Aleda Grana) 50 MCG/ACT nasal spray Place 1 spray into both nostrils daily. ?Patient not taking: Reported on 07/30/2018 12/21/17 12/18/18  Dahlia Byes A, NP  ?omega-3 acid ethyl esters (LOVAZA) 1 g capsule Take 1 capsule (1 g total) by mouth 2 (two) times daily. 08/01/18 12/18/18  Malvin Johns, MD  ?risperiDONE (RISPERDAL) 3 MG tablet Take 2 tablets (6 mg total) by mouth at bedtime. 08/01/18 12/18/18  Malvin Johns, MD  ?traZODone (DESYREL) 150 MG tablet Take 1 tablet (150 mg total) by mouth at bedtime as needed for sleep. 08/01/18 12/18/18  Malvin Johns, MD  ?   ? ?Allergies    ?Patient has no known allergies.   ? ?Review of Systems   ?Review of Systems  ?Neurological:  Positive for headaches.  ? ?Physical Exam ?Updated Vital Signs ?BP (!)  144/93 (BP Location: Right Arm)   Pulse 87   Temp (!) 97.5 ?F (36.4 ?C) (Oral)   Resp 18   Ht 5\' 8"  (1.727 m)   Wt 76 kg   SpO2 98%   BMI 25.48 kg/m?  ?Physical Exam ? ?ED Results / Procedures / Treatments   ?Labs ?(all labs ordered are listed, but only abnormal results are displayed) ?Labs Reviewed  ?COMPREHENSIVE METABOLIC PANEL - Abnormal; Notable for the following components:  ?    Result Value  ? Potassium 3.4 (*)   ? Glucose, Bld 121 (*)   ? All other components within normal limits  ?ETHANOL - Abnormal; Notable for the following components:  ? Alcohol, Ethyl (B) 42 (*)   ? All other components within normal limits  ?SALICYLATE LEVEL - Abnormal; Notable for the following components:  ? Salicylate Lvl <7.0 (*)   ? All other components within normal limits  ?ACETAMINOPHEN LEVEL - Abnormal; Notable for the following components:  ? Acetaminophen (Tylenol), Serum <10 (*)   ? All other components within normal limits  ?RESP PANEL BY RT-PCR (FLU A&B, COVID) ARPGX2  ?RAPID URINE DRUG SCREEN, HOSP PERFORMED  ?CBC WITH DIFFERENTIAL/PLATELET  ? ? ?EKG ?None ? ?Radiology ?CT HEAD WO CONTRAST ( ) ? ?Result Date: 07/23/2021 ?CLINICAL DATA:  Severe bilateral temporal headache for 3 days. History of intracranial hemorrhage. EXAM: CT  HEAD WITHOUT CONTRAST TECHNIQUE: Contiguous axial images were obtained from the base of the skull through the vertex without intravenous contrast. RADIATION DOSE REDUCTION: This exam was performed according to the departmental dose-optimization program which includes automated exposure control, adjustment of the mA and/or kV according to patient size and/or use of iterative reconstruction technique. COMPARISON:  09/20/2017. FINDINGS: Brain: No acute intracranial hemorrhage, midline shift or mass effect. No extra-axial fluid collection. Gray-white matter differentiation is within normal limits. No hydrocephalus. There is encephalomalacia along the inferior aspect of the frontal lobes  bilaterally, greater on the left than on the right and in the temporal lobe on the left. Vascular: No hyperdense vessel or unexpected calcification. Skull: Normal. Negative for fracture or focal lesion. Sinuses/Orbits: Mild mucosal thickening and debris is present in the left maxillary sinus and ethmoid air cells on the left. The orbits are within normal limits. Other: None. IMPRESSION: 1. No acute intracranial hemorrhage. 2. Encephalomalacia in the inferior aspect of the frontal lobes bilaterally and temporal lobe on the left, likely related to old trauma. Electronically Signed   By: Thornell Sartorius M.D.   On: 07/23/2021 02:53   ? ?Procedures ?Procedures  ? ? ?Medications Ordered in ED ?Medications  ?ibuprofen (ADVIL) tablet 600 mg (has no administration in time range)  ?alum & mag hydroxide-simeth (MAALOX/MYLANTA) 200-200-20 MG/5ML suspension 30 mL (has no administration in time range)  ?ibuprofen (ADVIL) tablet 600 mg (has no administration in time range)  ?potassium chloride SA (KLOR-CON M) CR tablet 40 mEq (has no administration in time range)  ?ARIPiprazole (ABILIFY) tablet 20 mg (has no administration in time range)  ?propranolol (INDERAL) tablet 20 mg (has no administration in time range)  ? ? ?ED Course/ Medical Decision Making/ A&P ?  ?                        ?Medical Decision Making ?Risk ?OTC drugs. ?Prescription drug management. ? ? ?Headache which appears benign.  No red flags to suggest serious pathology such as meningitis, subarachnoid hemorrhage.  No nausea, vomiting, photophobia to suggest migraine type headache.  He will be given a dose of ibuprofen for symptomatic relief.  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  Screening labs show mild hypokalemia and he is given a dose of oral potassium.  Respiratory pathogen panel is negative for influenza and COVID-19.  Drug screen is negative for cocaine, THC, amphetamines, barbiturates,  benzodiazepines, opiates.  Patient is medically cleared at this point, TTS consultation is requested. ? ? ? ? ? ? ? ?Final Clinical Impression(s) / ED Diagnoses ?Final diagnoses:  ?Paranoid ideation (HCC)  ?Acute nonintractable headache, unspecified headache type  ? ? ?Rx / DC Orders ?ED Discharge Orders   ? ? None  ? ?  ? ? ?  ?Dione Booze, MD ?07/23/21 [phone] ? ?

## 2021-07-23 NOTE — ED Notes (Signed)
Yjr pys mother is reporting that the pt does not take his med he is supposedd to.  The pt has notg been takikng med for months.  He is talking about prople sayhing thin gs to him.  He is c/o a hedadache and chest pain also ?

## 2021-07-23 NOTE — ED Provider Notes (Signed)
Emergency Medicine Observation Re-evaluation Note ? ?James [name] is a 31 y.o. male, seen on rounds today.  Pt initially presented to the ED for complaints of Headache ?Currently, the patient is standing in doorway. ? ?Physical Exam  ?BP (!) 154/98 (BP Location: Right Arm)   Pulse 84   Temp 97.6 ?F (36.4 ?C) (Oral)   Resp 16   Ht 5\' 8"  (1.727 m)   Wt 76 kg   SpO2 93%   BMI 25.48 kg/m?  ?Physical Exam ?General: Calm, nondistressed ?Cardiac: Extremities well perfused ?Lungs: Breathing is unlabored ?Psych: No agitation, not responding to internal stimuli. ? ?ED Course / MDM  ?EKG:  ? ?I have reviewed the labs performed to date as well as medications administered while in observation.  Recent changes in the last 24 hours include patient presented to the ED last night with complaints of a headache.  He also endorsed visual hallucinations and paranoia.  Psychiatry evaluated this morning and patient meets criteria for inpatient admission.  On assessment, patient requests STI and HIV testing.  These tests were ordered.  Psychiatry recommends restarting medications.  Per chart review, it does appear the patient was previously on Abilify 20 mg daily.  This was ordered. ? ?Plan  ?Current plan is for inpatient psychiatric admission. ? is not under involuntary commitment. ? ? ?  ?Paulette Blanch, MD ?07/23/21 1118 ? ?

## 2021-07-23 NOTE — ED Notes (Signed)
Mother James [name] [phone] would like an update asap ?

## 2021-07-23 NOTE — ED Triage Notes (Addendum)
Pt BIB EMS from home with c/o headache x 3 days. Pt has hx of subdural hematoma. Pt states that "evil people are doing voodoo on me." Pt states that he is seeing [place] people. Pt denies any psych hx ?

## 2021-07-23 NOTE — Progress Notes (Signed)
Patient did lie down in bed on his own and allowed me to cover him with blankets and said thank you. Was also okay with turning lights off to rest, tv is still on on low volume. ?

## 2021-07-23 NOTE — ED Notes (Signed)
Called and left message with [facility] for transport to [facility].  ?

## 2021-07-23 NOTE — Progress Notes (Signed)
Patient is falling asleep while sitting in chair watching game on television, I asked if he would like to ty to lay down to rest and he said he would when the game ends in 5 minutes and 45 seconds.  ?

## 2021-07-24 NOTE — ED Notes (Signed)
[facility] here to transport patient to [facility]. ?

## 2021-07-25 LAB — GC/CHLAMYDIA PROBE AMP (~~LOC~~) NOT AT ARMC
Chlamydia: NEGATIVE
Comment: NEGATIVE
Comment: NORMAL
Neisseria Gonorrhea: NEGATIVE

## 2021-08-18 ENCOUNTER — Ambulatory Visit (INDEPENDENT_AMBULATORY_CARE_PROVIDER_SITE_OTHER): Payer: BC Managed Care – PPO | Admitting: Family Medicine

## 2021-08-18 DIAGNOSIS — F29 Unspecified psychosis not due to a substance or known physiological condition: Secondary | ICD-10-CM

## 2021-08-18 DIAGNOSIS — R030 Elevated blood-pressure reading, without diagnosis of hypertension: Secondary | ICD-10-CM

## 2021-08-18 DIAGNOSIS — Z0000 Encounter for general adult medical examination without abnormal findings: Secondary | ICD-10-CM

## 2021-08-18 DIAGNOSIS — Z113 Encounter for screening for infections with a predominantly sexual mode of transmission: Secondary | ICD-10-CM

## 2021-08-18 NOTE — Progress Notes (Signed)
? ?New Patient Office Visit ? ?Subjective   ? ?Patient ID: James [name], male    DOB: May 09, 1990  Age: 31 y.o. MRN: [id number] ? ?CC:  ?Chief Complaint  ?Patient presents with  ? New Patient (Initial Visit)  ? ? ?HPI ?James [name] presents to establish care ?Last PCP - not sure of last primary, has been at least a few years ? ?Was admitted recently for psychosis earlier this month, started on olanzapine 20 mg - this was brought with patient to appt. He denies other medications at this time. He has some paperwork which indicates upcoming appt - this has our office listed for establishing visit. There is also a note for an appt with behavioral health. ? ?Patient is from [place]. Patient is not currently working. He enjoys watching sports, cooking, spending time with family. ? ?Outpatient Encounter Medications as of 08/18/2021  ?Medication Sig  ? OLANZapine (ZYPREXA) 20 MG tablet Take 20 mg by mouth at bedtime.  ? propranolol (INDERAL) 20 MG tablet Take 20 mg by mouth 3 (three) times daily.  ? ARIPiprazole (ABILIFY) 20 MG tablet Take 20 mg by mouth daily. (Patient not taking: Reported on 07/23/2021)  ? [DISCONTINUED] benztropine (COGENTIN) 0.5 MG tablet Take 1 tablet (0.5 mg total) by mouth 2 (two) times daily.  ? [DISCONTINUED] fluticasone (FLONASE) 50 MCG/ACT nasal spray Place 1 spray into both nostrils daily. (Patient not taking: Reported on 07/30/2018)  ? [DISCONTINUED] omega-3 acid ethyl esters (LOVAZA) 1 g capsule Take 1 capsule (1 g total) by mouth 2 (two) times daily.  ? [DISCONTINUED] risperiDONE (RISPERDAL) 3 MG tablet Take 2 tablets (6 mg total) by mouth at bedtime.  ? [DISCONTINUED] traZODone (DESYREL) 150 MG tablet Take 1 tablet (150 mg total) by mouth at bedtime as needed for sleep.  ? ?No facility-administered encounter medications on file as of 08/18/2021.  ? ? ?Past Medical History:  ?Diagnosis Date  ? Insomnia   ? Subdural hematoma (HCC)   ? ? ?Past Surgical History:  ?Procedure Laterality Date  ?  HAND SURGERY    ? ? ?Family History  ?Problem Relation Age of Onset  ? Healthy Mother   ? ? ?Social History  ? ?Socioeconomic History  ? Marital status: Single  ?  Spouse name: Not on file  ? Number of children: Not on file  ? Years of education: Not on file  ? Highest education level: Not on file  ?Occupational History  ? Not on file  ?Tobacco Use  ? Smoking status: Former  ?  Packs/day: 0.50  ?  Types: Cigarettes  ? Smokeless tobacco: Never  ?Vaping Use  ? Vaping Use: Never used  ?Substance and Sexual Activity  ? Alcohol use: Yes  ?  Comment: occasionally  ? Drug use: Not Currently  ?  Types: Marijuana  ? Sexual activity: Yes  ?  Birth control/protection: Condom  ?  Comment: occasional condom use  ?Other Topics Concern  ? Not on file  ?Social History Narrative  ? Not on file  ? ?Social Determinants of Health  ? ?Financial Resource Strain: Not on file  ?Food Insecurity: Not on file  ?Transportation Needs: Not on file  ?Physical Activity: Not on file  ?Stress: Not on file  ?Social Connections: Not on file  ?Intimate Partner Violence: Not on file  ? ? ?Objective   ? ?BP (!) 133/96   Pulse (!) 111   Temp 97.8 ?F (36.6 ?C) (Oral)   Ht 5\' 10"  (1.778 m)  Wt 205 lb 8 oz (93.2 kg)   SpO2 98%   BMI 29.49 kg/m?  ? ?Physical Exam ? ?31 yo male in no acute distress ?Cardiovascular exam with regular rate and rhythm, no murmur appreciated ?Lungs clear to auscultation bilaterally ? ?Assessment & Plan:  ? ?Problem List Items Addressed This Visit   ? ?  ? Other  ? Elevated blood pressure reading without diagnosis of hypertension  ?  Blood pressure borderline in office today, recommend monitoring blood pressure at home, DASH diet, regular aerobic exercise ?We will continue to monitor blood pressure in the office and assess need for pharmacotherapy ? ?  ?  ? Psychosis ([place])  ?  Long history of psychiatric issues in the past, patient does need to establish with behavioral health provider, it does appear that he has appointment  arranged since discharge from the hospital recently.  Encourage patient to continue with this follow-up ?Continue with current medications including olanzapine ?If patient has any trouble with establishing, recommend that he reach back out to [place] for assistance with referral ? ?  ?  ? ? ?Return in about 3 months (around 11/17/2021) for CPE with [facility] a few days prior.  ? ?Zenola Dezarn J [place], MD ? ? ?

## 2021-08-23 DIAGNOSIS — F29 Unspecified psychosis not due to a substance or known physiological condition: Secondary | ICD-10-CM | POA: Insufficient documentation

## 2021-08-23 NOTE — Assessment & Plan Note (Signed)
Blood pressure borderline in office today, recommend monitoring blood pressure at home, DASH diet, regular aerobic exercise ?We will continue to monitor blood pressure in the office and assess need for pharmacotherapy ?

## 2021-08-23 NOTE — Assessment & Plan Note (Signed)
Long history of psychiatric issues in the past, patient does need to establish with behavioral health provider, it does appear that he has appointment arranged since discharge from the hospital recently.  Encourage patient to continue with this follow-up ?Continue with current medications including olanzapine ?If patient has any trouble with establishing, recommend that he reach back out to [place] for assistance with referral ?

## 2021-08-24 NOTE — Addendum Note (Signed)
Addended by: DE [place], Purvis Sidle J on: 08/24/2021 04:45 PM ? ? Modules accepted: Orders ? ?

## 2021-09-21 ENCOUNTER — Telehealth (HOSPITAL_BASED_OUTPATIENT_CLINIC_OR_DEPARTMENT_OTHER): Payer: Self-pay | Admitting: Family Medicine

## 2021-09-21 NOTE — Telephone Encounter (Signed)
Pt wanting STD and HIV tetsing added to his CPE labs for 7/20.  Please advise.

## 2021-10-03 NOTE — Telephone Encounter (Signed)
Yes I will get them added on.  Thanks, [employer]

## 2021-11-10 ENCOUNTER — Ambulatory Visit (HOSPITAL_BASED_OUTPATIENT_CLINIC_OR_DEPARTMENT_OTHER): Payer: Commercial Managed Care - HMO

## 2021-11-10 DIAGNOSIS — Z0000 Encounter for general adult medical examination without abnormal findings: Secondary | ICD-10-CM

## 2021-11-10 DIAGNOSIS — Z113 Encounter for screening for infections with a predominantly sexual mode of transmission: Secondary | ICD-10-CM

## 2021-11-10 NOTE — Addendum Note (Signed)
Addended by: DE [place], Marcy Salvo J on: 11/10/2021 11:09 AM   Modules accepted: Orders

## 2021-11-16 LAB — COMPREHENSIVE METABOLIC PANEL
ALT: 14 IU/L (ref 0–44)
AST: 17 IU/L (ref 0–40)
Albumin/Globulin Ratio: 1.8 (ref 1.2–2.2)
Albumin: 4.3 g/dL (ref 4.1–5.1)
Alkaline Phosphatase: 61 IU/L (ref 44–121)
BUN/Creatinine Ratio: 11 (ref 9–20)
BUN: 14 mg/dL (ref 6–20)
Bilirubin Total: 0.3 mg/dL (ref 0.0–1.2)
CO2: 22 mmol/L (ref 20–29)
Calcium: 9.7 mg/dL (ref 8.7–10.2)
Chloride: 107 mmol/L — ABNORMAL HIGH (ref 96–106)
Creatinine, Ser: 1.27 mg/dL (ref 0.76–1.27)
Globulin, Total: 2.4 g/dL (ref 1.5–4.5)
Glucose: 92 mg/dL (ref 70–99)
Potassium: 4.1 mmol/L (ref 3.5–5.2)
Sodium: 143 mmol/L (ref 134–144)
Total Protein: 6.7 g/dL (ref 6.0–8.5)
eGFR: 77 mL/min/{1.73_m2} (ref >59)

## 2021-11-16 LAB — CBC WITH DIFFERENTIAL/PLATELET
Basophils Absolute: 0 10*3/uL (ref 0.0–0.2)
Basos: 1 %
EOS (ABSOLUTE): 0.1 10*3/uL (ref 0.0–0.4)
Eos: 3 %
Hematocrit: 46.8 % (ref 37.5–51.0)
Hemoglobin: 15.4 g/dL (ref 13.0–17.7)
Immature Grans (Abs): 0 10*3/uL (ref 0.0–0.1)
Immature Granulocytes: 0 %
Lymphocytes Absolute: 1.4 10*3/uL (ref 0.7–3.1)
Lymphs: 31 %
MCH: 27.5 pg (ref 26.6–33.0)
MCHC: 32.9 g/dL (ref 31.5–35.7)
MCV: 84 fL (ref 79–97)
Monocytes Absolute: 0.3 10*3/uL (ref 0.1–0.9)
Monocytes: 8 %
Neutrophils Absolute: 2.6 10*3/uL (ref 1.4–7.0)
Neutrophils: 57 %
Platelets: 328 10*3/uL (ref 150–450)
RBC: 5.6 x10E6/uL (ref 4.14–5.80)
RDW: 13.2 % (ref 11.6–15.4)
WBC: 4.5 10*3/uL (ref 3.4–10.8)

## 2021-11-16 LAB — HEPATITIS C ANTIBODY: Hep C Virus Ab: NONREACTIVE

## 2021-11-16 LAB — LIPID PANEL
Chol/HDL Ratio: 7.5 ratio — ABNORMAL HIGH (ref 0.0–5.0)
Cholesterol, Total: 249 mg/dL — ABNORMAL HIGH (ref 100–199)
HDL: 33 mg/dL — ABNORMAL LOW (ref >39)
LDL Chol Calc (NIH): 188 mg/dL — ABNORMAL HIGH (ref 0–99)
Triglycerides: 150 mg/dL — ABNORMAL HIGH (ref 0–149)
VLDL Cholesterol Cal: 28 mg/dL (ref 5–40)

## 2021-11-16 LAB — CT, NG, MYCOPLASMAS NAA, URINE
Chlamydia trachomatis, NAA: NEGATIVE
Mycoplasma genitalium NAA: NEGATIVE
Mycoplasma hominis NAA: POSITIVE — AB
Neisseria gonorrhoeae, NAA: NEGATIVE
Ureaplasma spp NAA: POSITIVE — AB

## 2021-11-16 LAB — HEMOGLOBIN A1C
Est. average glucose Bld gHb Est-mCnc: 108 mg/dL
Hgb A1c MFr Bld: 5.4 % (ref 4.8–5.6)

## 2021-11-16 LAB — HIV ANTIBODY (ROUTINE TESTING W REFLEX): HIV Screen 4th Generation wRfx: NONREACTIVE

## 2021-11-16 LAB — TSH RFX ON ABNORMAL TO FREE T4: TSH: 1.19 u[IU]/mL (ref 0.450–4.500)

## 2021-11-17 ENCOUNTER — Encounter (HOSPITAL_BASED_OUTPATIENT_CLINIC_OR_DEPARTMENT_OTHER): Payer: BC Managed Care – PPO | Admitting: Family Medicine

## 2021-11-17 ENCOUNTER — Encounter (HOSPITAL_BASED_OUTPATIENT_CLINIC_OR_DEPARTMENT_OTHER): Payer: Self-pay

## 2021-11-17 ENCOUNTER — Telehealth (HOSPITAL_BASED_OUTPATIENT_CLINIC_OR_DEPARTMENT_OTHER): Payer: Self-pay | Admitting: Family Medicine

## 2021-11-17 NOTE — Telephone Encounter (Signed)
Pt came in late to appt on 11/17/21. Stated he would like a CMA to give him a call regarding his lab results. Pt stated he missed one last regarding these from our office so he would like someone to call him back. Please advise.

## 2021-11-17 NOTE — Telephone Encounter (Signed)
Lab results have been acknowledged and updated by provider.

## 2021-11-25 ENCOUNTER — Encounter (HOSPITAL_BASED_OUTPATIENT_CLINIC_OR_DEPARTMENT_OTHER): Payer: Self-pay | Admitting: Family Medicine

## 2021-11-25 ENCOUNTER — Ambulatory Visit (INDEPENDENT_AMBULATORY_CARE_PROVIDER_SITE_OTHER): Payer: BC Managed Care – PPO | Admitting: Family Medicine

## 2021-11-25 DIAGNOSIS — Z0000 Encounter for general adult medical examination without abnormal findings: Secondary | ICD-10-CM

## 2021-11-25 DIAGNOSIS — E785 Hyperlipidemia, unspecified: Secondary | ICD-10-CM | POA: Insufficient documentation

## 2021-11-25 MED ORDER — OLANZAPINE 20 MG PO TABS: 20.0000 mg | ORAL_TABLET | Freq: Every day | ORAL | 1 refills | Status: DC

## 2021-11-25 NOTE — Progress Notes (Signed)
Subjective:    CC: Annual Physical Exam  HPI:  James [name] is a 31 y.o. presenting for annual physical  I reviewed the past medical history, family history, social history, surgical history, and allergies today and no changes were needed.  Please see the problem list section below in epic for further details.  Past Medical History: Past Medical History:  Diagnosis Date   Insomnia    Subdural hematoma (HCC)    Past Surgical History: Past Surgical History:  Procedure Laterality Date   HAND SURGERY     Social History: Social History   Socioeconomic History   Marital status: Single    Spouse name: Not on file   Number of children: Not on file   Years of education: Not on file   Highest education level: Not on file  Occupational History   Not on file  Tobacco Use   Smoking status: Former    Packs/day: 0.50    Types: Cigarettes   Smokeless tobacco: Never  Vaping Use   Vaping Use: Never used  Substance and Sexual Activity   Alcohol use: Yes    Comment: occasionally   Drug use: Not Currently    Types: Marijuana   Sexual activity: Yes    Birth control/protection: Condom    Comment: occasional condom use  Other Topics Concern   Not on file  Social History Narrative   Not on file   Social Determinants of Health   Financial Resource Strain: Not on file  Food Insecurity: Not on file  Transportation Needs: Not on file  Physical Activity: Not on file  Stress: Not on file  Social Connections: Not on file   Family History: Family History  Problem Relation Age of Onset   Healthy Mother    Allergies: No Known Allergies Medications: See med rec.  Review of Systems: No headache, visual changes, nausea, vomiting, diarrhea, constipation, dizziness, abdominal pain, skin rash, fevers, chills, night sweats, swollen lymph nodes, weight loss, chest pain, body aches, joint swelling, muscle aches, shortness of breath, mood changes, visual or auditory  hallucinations.  Objective:    BP (!) 136/100   Pulse 83   Temp 97.6 F (36.4 C) (Oral)   Ht 5\' 10"  (1.778 m)   Wt 201 lb (91.2 kg)   SpO2 100%   BMI 28.84 kg/m   General: Well Developed, well nourished, and in no acute distress.  Neuro: Alert and oriented x3, extra-ocular muscles intact, sensation grossly intact. Cranial nerves II through XII are intact, motor, sensory, and coordinative functions are all intact. HEENT: Normocephalic, atraumatic, pupils equal round reactive to light, neck supple, no masses, no lymphadenopathy, thyroid nonpalpable. Oropharynx, nasopharynx, external ear canals are unremarkable. Skin: Warm and dry, no rashes noted. Cardiac: Regular rate and rhythm, no murmurs rubs or gallops. Respiratory: Clear to auscultation bilaterally. Not using accessory muscles, speaking in full sentences. Abdominal: Soft, nontender, nondistended, positive bowel sounds, no masses, no organomegaly. Musculoskeletal: Shoulder, elbow, wrist, hip, knee, ankle stable, and with full range of motion.  Impression and Recommendations:    Wellness examination Routine HCM labs reviewed. HCM reviewed/discussed. Anticipatory guidance regarding healthy weight, lifestyle and choices given. Recommend healthy diet.  Recommend approximately 150 minutes/week of moderate intensity exercise Recommend regular dental and vision exams Always use seatbelt/lap and shoulder restraints Recommend using smoke alarms and checking batteries at least twice a year Recommend using sunscreen when outside Patient is up-to-date on tetanus  Blood pressure borderline in office today.  Recommend intermittent monitoring of  blood pressure at home, DASH diet.  Plan for follow-up in a few months to assess progress, recheck blood pressure.  Previously was referred to psychiatry, has not established as of yet.  It does appear that psychiatry office had reached out to patient without success.  Discussed with referral  specialist today.  Will reopen referral to psychiatry and provide patient with psychiatry office information today so that he may reach out to schedule establishing visit.  Return in about 4 months (around 03/27/2022) for BP.   ___________________________________________ Melida Northington de [place], MD, ABFM, [facility]

## 2021-11-25 NOTE — Patient Instructions (Signed)

## 2021-11-25 NOTE — Assessment & Plan Note (Signed)
Routine HCM labs reviewed. HCM reviewed/discussed. Anticipatory guidance regarding healthy weight, lifestyle and choices given. Recommend healthy diet.  Recommend approximately 150 minutes/week of moderate intensity exercise Recommend regular dental and vision exams Always use seatbelt/lap and shoulder restraints Recommend using smoke alarms and checking batteries at least twice a year Recommend using sunscreen when outside Patient is up-to-date on tetanus

## 2021-12-21 ENCOUNTER — Ambulatory Visit (HOSPITAL_COMMUNITY)
Admission: EM | Admit: 2021-12-21 | Discharge: 2021-12-22 | Disposition: A | Discharge status: Other Acute Inpatient Hospital | Payer: Commercial Managed Care - HMO | Attending: Family | Admitting: Family

## 2021-12-21 DIAGNOSIS — Z20822 Contact with and (suspected) exposure to covid-19: Secondary | ICD-10-CM | POA: Diagnosis not present

## 2021-12-21 DIAGNOSIS — F17210 Nicotine dependence, cigarettes, uncomplicated: Secondary | ICD-10-CM | POA: Insufficient documentation

## 2021-12-21 DIAGNOSIS — G4700 Insomnia, unspecified: Secondary | ICD-10-CM | POA: Diagnosis not present

## 2021-12-21 DIAGNOSIS — Z79899 Other long term (current) drug therapy: Secondary | ICD-10-CM | POA: Insufficient documentation

## 2021-12-21 DIAGNOSIS — F319 Bipolar disorder, unspecified: Secondary | ICD-10-CM | POA: Insufficient documentation

## 2021-12-21 DIAGNOSIS — S069XAA Unspecified intracranial injury with loss of consciousness status unknown, initial encounter: Secondary | ICD-10-CM | POA: Diagnosis not present

## 2021-12-21 DIAGNOSIS — F2081 Schizophreniform disorder: Secondary | ICD-10-CM | POA: Diagnosis not present

## 2021-12-21 LAB — COMPREHENSIVE METABOLIC PANEL
ALT: 21 U/L (ref 0–44)
AST: 21 U/L (ref 15–41)
Albumin: 4 g/dL (ref 3.5–5.0)
Alkaline Phosphatase: 53 U/L (ref 38–126)
Anion gap: 9 (ref 5–15)
BUN: 11 mg/dL (ref 6–20)
CO2: 25 mmol/L (ref 22–32)
Calcium: 9.6 mg/dL (ref 8.9–10.3)
Chloride: 106 mmol/L (ref 98–111)
Creatinine, Ser: 1.17 mg/dL (ref 0.61–1.24)
GFR, Estimated: >60 mL/min (ref >60)
Glucose, Bld: 133 mg/dL — ABNORMAL HIGH (ref 70–99)
Potassium: 3.7 mmol/L (ref 3.5–5.1)
Sodium: 140 mmol/L (ref 135–145)
Total Bilirubin: 0.4 mg/dL (ref 0.3–1.2)
Total Protein: 7.1 g/dL (ref 6.5–8.1)

## 2021-12-21 LAB — CBC WITH DIFFERENTIAL/PLATELET
Abs Immature Granulocytes: 0.01 10*3/uL (ref 0.00–0.07)
Basophils Absolute: 0 10*3/uL (ref 0.0–0.1)
Basophils Relative: 1 %
Eosinophils Absolute: 0.1 10*3/uL (ref 0.0–0.5)
Eosinophils Relative: 2 %
HCT: 43.6 % (ref 39.0–52.0)
Hemoglobin: 14.7 g/dL (ref 13.0–17.0)
Immature Granulocytes: 0 %
Lymphocytes Relative: 25 %
Lymphs Abs: 1.1 10*3/uL (ref 0.7–4.0)
MCH: 27.8 pg (ref 26.0–34.0)
MCHC: 33.7 g/dL (ref 30.0–36.0)
MCV: 82.6 fL (ref 80.0–100.0)
Monocytes Absolute: 0.2 10*3/uL (ref 0.1–1.0)
Monocytes Relative: 4 %
Neutro Abs: 3 10*3/uL (ref 1.7–7.7)
Neutrophils Relative %: 68 %
Platelets: 263 10*3/uL (ref 150–400)
RBC: 5.28 MIL/uL (ref 4.22–5.81)
RDW: 12.9 % (ref 11.5–15.5)
WBC: 4.4 10*3/uL (ref 4.0–10.5)
nRBC: 0 % (ref 0.0–0.2)

## 2021-12-21 LAB — LIPID PANEL
Cholesterol: 254 mg/dL — ABNORMAL HIGH (ref 0–200)
HDL: 36 mg/dL — ABNORMAL LOW (ref >40)
LDL Cholesterol: 200 mg/dL — ABNORMAL HIGH (ref 0–99)
Total CHOL/HDL Ratio: 7.1 RATIO
Triglycerides: 91 mg/dL (ref <150)
VLDL: 18 mg/dL (ref 0–40)

## 2021-12-21 LAB — POCT URINE DRUG SCREEN - MANUAL ENTRY (I-SCREEN)
POC Amphetamine UR: NOT DETECTED
POC Buprenorphine (BUP): NOT DETECTED
POC Cocaine UR: NOT DETECTED
POC Marijuana UR: NOT DETECTED
POC Methadone UR: NOT DETECTED
POC Methamphetamine UR: NOT DETECTED
POC Morphine: NOT DETECTED
POC Oxazepam (BZO): NOT DETECTED
POC Oxycodone UR: NOT DETECTED
POC Secobarbital (BAR): NOT DETECTED

## 2021-12-21 LAB — RESP PANEL BY RT-PCR (FLU A&B, COVID) ARPGX2
Influenza A by PCR: NEGATIVE
Influenza B by PCR: NEGATIVE
SARS Coronavirus 2 by RT PCR: NEGATIVE

## 2021-12-21 LAB — ETHANOL: Alcohol, Ethyl (B): <10 mg/dL (ref <10)

## 2021-12-21 LAB — TSH: TSH: 1.335 u[IU]/mL (ref 0.350–4.500)

## 2021-12-21 LAB — MAGNESIUM: Magnesium: 2.2 mg/dL (ref 1.7–2.4)

## 2021-12-21 MED ORDER — MAGNESIUM HYDROXIDE 400 MG/5ML PO SUSP: 30.0000 mL | Freq: Every day | ORAL | Status: DC | PRN

## 2021-12-21 MED ORDER — TRAZODONE HCL 50 MG PO TABS: 50.0000 mg | ORAL_TABLET | Freq: Every evening | ORAL | Status: DC | PRN

## 2021-12-21 MED ORDER — OLANZAPINE 5 MG PO TBDP: 5.0000 mg | ORAL_TABLET | Freq: Three times a day (TID) | ORAL | Status: DC | PRN

## 2021-12-21 MED ORDER — ACETAMINOPHEN 325 MG PO TABS: 650.0000 mg | ORAL_TABLET | Freq: Four times a day (QID) | ORAL | Status: DC | PRN

## 2021-12-21 MED ORDER — ZIPRASIDONE MESYLATE 20 MG IM SOLR
20.0000 mg | INTRAMUSCULAR | Status: DC | PRN
Start: 2021-12-21 — End: 2021-12-22

## 2021-12-21 MED ORDER — OLANZAPINE 10 MG PO TBDP
10.0000 mg | ORAL_TABLET | Freq: Every day | ORAL | Status: DC
  Administered 2021-12-21: 10 mg via ORAL
  Filled 2021-12-21: qty 1

## 2021-12-21 MED ORDER — ALUM & MAG HYDROXIDE-SIMETH 200-200-20 MG/5ML PO SUSP: 30.0000 mL | ORAL | Status: DC | PRN

## 2021-12-21 MED ORDER — LORAZEPAM 1 MG PO TABS: 1.0000 mg | ORAL_TABLET | ORAL | Status: DC | PRN

## 2021-12-21 NOTE — BH Assessment (Signed)
Comprehensive Clinical Assessment (CCA) Note  12/21/2021 James [name] [id number]  Disposition:  Per James Heater, NP, Inpatient Treatment is recommended  The patient demonstrates the following risk factors for suicide: Chronic risk factors for suicide include: psychiatric disorder of schizophrenia . Acute risk factors for suicide include: family or marital conflict and social withdrawal/isolation. Protective factors for this patient include: positive social support. Considering these factors, the overall suicide risk at this point appears to be low. Patient is not appropriate for outpatient follow up due to his current psychosis.  AIMS    Flowsheet Row Ad[facility] (Discharged) [facility]  AIMS Total Score 0      AUDIT    Flowsheet Row Admission (Discharged) [facility]  Alcohol Use Disorder Identification Test Final Score (AUDIT) 1      PHQ2-9    Flowsheet Row ED from 12/21/2021 in [facility] Office Visit from 11/25/2021 in [facility] Office Visit from 08/18/2021 in [facility]  PHQ-2 Total Score 0 0 0  PHQ-9 Total Score 5 0 --      Flowsheet Row ED from 12/21/2021 in [facility] ED from 07/23/2021 in [facility] EMERGENCY DEPARTMENT ED from 01/06/2021 in [facility] RISK CATEGORY No Risk No Risk No Risk        Chief Complaint:  Chief Complaint  Patient presents with   IVC/Psychotic   Visit Diagnosis: F20.3 Schizophrenia    CCA Screening, Triage and Referral (STR)  Patient Reported Information How did you hear about [place]? Legal System (IVC)  What Is the Reason for Your Visit/Call Today? Patient was brought to the [facility] on IVC petitioned by his mother.   Patient is diagnosed with schizophrenia, off meds.  He is delusional and experiencing auditory hallucinations.  His behavior at home has been erractic and threatening.  Patient is very disorganized.  Patient is urgent  Patient's mother reports on IVC that patient has made false reports to the police concerning his children stating that they are being molested when they are not.  Patient states that people are working voodoo on his children.  Mother reports that patient has been making threats to kill her, he has been threatening to others and he has been screaming at strangers. She states that he has hearing voices.  Patient denies all of this.  Patient presents as being very disorganized and has loose thought associations.  His answers to questions are not logical and he is tangential.  His judgment, insight and impulse control are impaired.  He is oriented and alert.  His memory appears to be intact.  Patient does not currently appear to be responding to any internal stimuli.  His speech is normal in tone and rate and his eye contact is good.  How Long Has This Been Causing You Problems? 1 wk - 1 month  What Do You Feel Would Help You the Most Today? Treatment for Depression or other mood problem   Have You Recently Had Any Thoughts About Hurting Yourself? No  Are You Planning to Commit Suicide/Harm Yourself At This time? No   Have you Recently Had Thoughts About Hurting Someone James [name]? No  Are You Planning to Harm Someone at This Time? No  Explanation: No data recorded  Have You Used  Any Alcohol or Drugs in the Past 24 Hours? No  How Long Ago Did You Use Drugs or Alcohol? No data recorded What Did You Use and How Much? liqour, 4-shots   Do You Currently Have a Therapist/Psychiatrist? No  Name of Therapist/Psychiatrist: No data recorded  Have You Been Recently Discharged From Any Office Practice or Programs? No  Explanation of Discharge From Practice/Program: No data  recorded    CCA Screening Triage Referral Assessment Type of Contact: Tele-Assessment  Telemedicine Service Delivery:   Is this Initial or Reassessment? Initial Assessment  Date Telepsych consult ordered in [facility]:  07/23/21  Time Telepsych consult ordered in [facility]:  No data recorded Location of Assessment: [facility] ED  Provider Location: [facility] Assessment Services   Collateral Involvement: No collateral involved.   Does Patient Have a Automotive engineer Guardian? No data recorded Name and Contact of Legal Guardian: No data recorded If Minor and Not Living with Parent(s), Who has Custody? n/a  Is CPS involved or ever been involved? Never  Is APS involved or ever been involved? Never   Patient Determined To Be At Risk for Harm To Self or Others Based on Review of Patient Reported Information or Presenting Complaint? No  Method: No data recorded Availability of Means: No data recorded Intent: No data recorded Notification Required: No data recorded Additional Information for Danger to Others Potential: No data recorded Additional Comments for Danger to Others Potential: No data recorded Are There Guns or Other Weapons in Your Home? No data recorded Types of Guns/Weapons: No data recorded Are These Weapons Safely Secured?                            No data recorded Who Could Verify You Are Able To Have These Secured: No data recorded Do You Have any Outstanding Charges, Pending Court Dates, Parole/Probation? No data recorded Contacted To Inform of Risk of Harm To Self or Others: -- (No need to notify)    Does Patient Present under Involuntary Commitment? No  IVC Papers Initial File Date: No data recorded  [place] of Residence: [place]   Patient Currently Receiving the Following Services: Not Receiving Services   Determination of Need: Urgent (48 hours)   Options For Referral: Inpatient Hospitalization     CCA Biopsychosocial Patient Reported  Schizophrenia/Schizoaffective Diagnosis in Past: Yes   Strengths: UTA   Mental Health Symptoms Depression:   Difficulty Concentrating; Irritability   Duration of Depressive symptoms:  Duration of Depressive Symptoms: Greater than two weeks   Mania:   Change in energy/activity; Increased Energy; Overconfidence; Racing thoughts; Recklessness   Anxiety:    Restlessness; Worrying; Tension   Psychosis:   Hallucinations; Delusions   Duration of Psychotic symptoms:  Duration of Psychotic Symptoms: Greater than six months   Trauma:   Difficulty staying/falling asleep   Obsessions:   None   Compulsions:   None   Inattention:   None   Hyperactivity/Impulsivity:   Blurts out answers; Feeling of restlessness; Talks excessively   Oppositional/Defiant Behaviors:   None   Emotional Irregularity:   Transient, stress-related paranoia/disassociation   Other Mood/Personality Symptoms:   anxious, restlessness    Mental Status Exam Appearance and self-care  Stature:   Average   Weight:   Average weight   Clothing:   Casual   Grooming:   Normal   Cosmetic use:   None   Posture/gait:   Normal   Motor activity:  Repetitive; Restless   Sensorium  Attention:   Confused; Persistent   Concentration:   Scattered; Focuses on irrelevancies; Preoccupied   Orientation:   Object; Place; Person; Situation   Recall/memory:   Normal   Affect and Mood  Affect:   Anxious; Labile   Mood:   Irritable; Hypomania   Relating  Eye contact:   Fleeting   Facial expression:   Anxious; Responsive; Tense   Attitude toward examiner:   Defensive; Manipulative   Thought and Language  Speech flow:  Articulation error; Flight of Ideas   Thought content:   Delusions; Persecutions; Suspicious   Preoccupation:   None   Hallucinations:   Auditory; Visual   Organization:  No data recorded  [employer] of Knowledge:   Fair   Intelligence:    Needs investigation   Abstraction:   Abstract   Judgement:   Impaired   Reality Testing:   Distorted   Insight:   Fair   Decision Making:   Impulsive; Confused   Social Functioning  Social Maturity:   Irresponsible; Impulsive   Social Judgement:   Heedless   Stress  Stressors:   Relationship; Work   Coping Ability:   Overwhelmed; Exhausted   Skill Deficits:   Decision making; Interpersonal; Self-care; Self-control   Supports:   Family     Religion: Religion/Spirituality Are You A Religious Person?:  (not assessed) How Might This Affect Treatment?: UTA  Leisure/Recreation:    Exercise/Diet: Exercise/Diet Do You Exercise?: Yes What Type of Exercise Do You Do?: Run/Walk How Many Times a Week Do You Exercise?: 1-3 times a week Have You Gained or Lost A Significant Amount of Weight in the Past Six Months?: No Do You Follow a Special Diet?: No Do You Have Any Trouble Sleeping?: Yes Explanation of Sleeping Difficulties: Pt reports four or five hours during the night.   CCA Employment/Education Employment/Work Situation: Employment / Work Situation Employment Situation: Unemployed Patient's Job has Been Impacted by Current Illness: No Has Patient ever Been in Equities trader?: No  Education: Education Is Patient Currently Attending School?: No Last Grade Completed: 12 Did You Product manager?: No Did You Have An Individualized Education Program (IIEP): No Did You Have Any Difficulty At [employer]?: No Patient's Education Has Been Impacted by Current Illness: No   CCA Family/Childhood History Family and Relationship History: Family history Marital status: Single Does patient have children?: Yes How many children?: 2 How is patient's relationship with their children?: UTA  Childhood History:  Childhood History By whom was/is the patient raised?: Both parents Did patient suffer any verbal/emotional/physical/sexual abuse as a child?: No Did patient  suffer from severe childhood neglect?: No Has patient ever been sexually abused/assaulted/raped as an adolescent or adult?: No Was the patient ever a victim of a crime or a disaster?: No Witnessed domestic violence?: No Has patient been affected by domestic violence as an adult?: No  Child/Adolescent Assessment:     CCA Substance Use Alcohol/Drug Use: Alcohol / Drug Use Pain Medications: See MAR Prescriptions: See MAR Over the Counter: See MAR History of alcohol / drug use?: Yes Longest period of sobriety (when/how long): patient states that he has not used in two years Negative Consequences of Use: Personal relationships Withdrawal Symptoms: Agitation, Aggressive/Assaultive                         ASAM's:  Six Dimensions of Multidimensional Assessment  Dimension 1:  Acute Intoxication and/or Withdrawal Potential:  Dimension 1:  Description of individual's past and current experiences of substance use and withdrawal: Patient denies any current withdrawal symptoms  Dimension 2:  Biomedical Conditions and Complications:   Dimension 2:  Description of patient's biomedical conditions and  complications: Head injury  Dimension 3:  Emotional, Behavioral, or Cognitive Conditions and Complications:  Dimension 3:  Description of emotional, behavioral, or cognitive conditions and complications: Schizophrenia  Dimension 4:  Readiness to Change:  Dimension 4:  Description of Readiness to Change criteria: Patient is claiming that he has maintained abstinence for 2 years  Dimension 5:  Relapse, Continued use, or Continued Problem Potential:  Dimension 5:  Relapse, continued use, or continued problem potential critiera description: Patient is claiming that he has maintained abstinence for 2 years  Dimension 6:  Recovery/Living Environment:  Dimension 6:  Recovery/Iiving environment criteria description: Pt reports he live with brother and mother in a safe environment.  ASAM Severity  Score: ASAM's Severity Rating Score: 11  ASAM Recommended Level of Treatment: ASAM Recommended Level of Treatment: Level I Outpatient Treatment   Substance use Disorder (SUD) Substance Use Disorder (SUD)  Checklist Symptoms of Substance Use: Continued use despite having a persistent/recurrent physical/psychological problem caused/exacerbated by use, Continued use despite persistent or recurrent social, interpersonal problems, caused or exacerbated by use, Large amounts of time spent to obtain, use or recover from the substance(s), Recurrent use that results in a failure to fulfill major role obligations (work, school, home), Social, occupational, recreational activities given up or reduced due to use, Substance(s) often taken in larger amounts or over longer times than was intended (Patient is claiming that he has maintained abstinence for 2 years)  Recommendations for Services/Supports/Treatments: Recommendations for Services/Supports/Treatments Recommendations For Services/Supports/Treatments: Residential-Level 1  Discharge Disposition:    DSM5 Diagnoses: Patient Active Problem List   Diagnosis Date Noted   Wellness examination 11/25/2021   Hyperlipidemia 11/25/2021   Psychosis (HCC) 08/23/2021   Elevated blood pressure reading without diagnosis of hypertension 08/18/2021   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

## 2021-12-21 NOTE — Progress Notes (Signed)
Patient was brought to the [facility] on IVC petitioned by his mother.  Patient is diagnosed with schizophrenia, off meds.  He is delusional and experiencing auditory hallucinations.  His behavior at home has been erractic and threatening.  Patient is very disorganized.  Patient is urgent

## 2021-12-21 NOTE — ED Notes (Signed)
Pt refused to given UDS at this time.

## 2021-12-21 NOTE — ED Notes (Signed)
James [name] c/o chest pain denies SOB skin color WNL 12 EKG in progress provider R. Mayford Knife made aware.

## 2021-12-21 NOTE — ED Provider Notes (Signed)
[facility] Continuous Assessment Admission H&P  Date: 12/21/21 Patient Name: James [name] MRN: [id number] Chief Complaint:  Chief Complaint  Patient presents with   IVC/Psychotic      Diagnoses:  Final diagnoses:  Schizophreniform disorder (HCC)    HPI: Patient presents to [facility] under involuntary commitment, transported via law enforcement.  Involuntary commitment, initiated by patient's mother, Angela Owens, reads: "Petitioner states: The respondent has been diagnosed schizophrenia, bipolar disorder.  The respondent has not been eating, sleeping or attending to personal hygiene.  The respondent says voices talk to him and tell him to hurt others then the voices have told the respondent to kill me.  Respondent says voices in the wall talk to him all day long and tell him to do things.  The respondent screams at people he does not know.  Yesterday he sent the police to his kids grandma's house with false accusations of rape/molestation by her boyfriend.  The respondent has been using social media to threaten people."  Patient is assessed, face-to-face, by nurse practitioner.  He is seated in assessment area, no apparent distress.  He is alert and oriented, pleasant and cooperative during assessment.  Patient presents with anxious mood, congruent affect.  James [name] endorses apparent paranoid ideations and delusions.  He states "people keep talking to me, the [place] Chief of Police and the coach of the [employer] have money saved up and they are trying to move my family so that we can get a new house that does not have voices in it."  Patient endorses auditory hallucinations, "I hear voices, they are in the walls." No visual hallucinations reported, denies command hallucinations.   Patient reports "people are trying to move my family out of our apartment and they are practicing voodoo and soul ties on my mother and my kids."  Patient reports he has  contacted law enforcement both yesterday and today related to "sexual stuff" that unidentified entities are planning to perform on his children.  Patient reports on last school year he "walked around the school every day to make sure they are not putting voodoo on my kids."  Patient reports multiple people would like to attempt to kill him "in real life."  Additionally he believes conversations on his cell phone are recorded and social media is tracking his behavior.  Patient reports "the [place], the governor, the [employer], the mayor and the school board superintendent have heard about the voodoo that has been directed toward my family."  James [name] has been diagnosed with schizophreniform disorder, psychotic disorder and substance-induced psychosis.  He has also been diagnosed with traumatic brain injury.  He is not linked with outpatient psychiatry currently.  He is not currently prescribed any medications per his report.  He reports history of being prescribed medication to address anxiety however "I stopped because it caused weight gain, I read online."   He last had medication to address his mood approximately 2 months ago.  He endorses multiple previous inpatient psychiatric hospitalizations.  Most recently he reports spending 18 days at [facility] in April 2023.  He would prefer not to return to [facility] because "they like to try to practice on people."  He has also been hospitalized at [facility] in 2019, he prefers not to return to [facility] because "gang members works there and they put drugs in your food and drink there."  James [name] resides in [place] with his mother and 2 brothers.    His children ages 8 years old and 5 years old reside with their mother and maternal grandmother.  He denies access to weapons.  He is not currently employed.  He denies alcohol and substance use. Patient endorses average sleep and appetite, per petitioner, patient "not  sleeping."  Patient denies suicidal and homicidal ideations at this time. Denies threatening others via social media.   Treatment plan to include inpatient psychiatric hospitalization.  Reviewed medication, olanzapine 10 mg nightly, reviewed side effects and offered patient opportunity to ask questions.  Patient verbalizes understanding of treatment plan.  Patient offered support and encouragement.  He gives verbal consent to speak with his mother, Angela Owens.  Attempted to reach patient's mother, Angela Owens, phone number [phone].  Unable to leave message, voicemail full.  PHQ 2-9:  Flowsheet Row ED from 12/21/2021 in [facility] Office Visit from 11/25/2021 in [facility]  Thoughts that you would be better off dead, or of hurting yourself in some way Not at all Not at all  PHQ-9 Total Score 5 0       Flowsheet Row ED from 12/21/2021 in [facility] ED from 07/23/2021 in [facility] ED from 01/06/2021 in [facility] [place]  C-SSRS RISK CATEGORY No Risk No Risk No Risk        Total Time spent with patient: 30 minutes  Musculoskeletal  Strength & Muscle Tone: within normal limits Gait & Station: normal Patient leans: N/A  Psychiatric Specialty Exam  Presentation General Appearance: Casual  Eye Contact:Good  Speech:Normal Rate; Clear and Coherent  Speech Volume:Normal  Handedness:Right   Mood and Affect  Mood:Anxious  Affect:Congruent   Thought Process  Thought Processes:Disorganized  Descriptions of Associations:Tangential  Orientation:Full (Time, Place and Person)  Thought Content:Paranoid Ideation; Tangential; Delusions  Diagnosis of Schizophrenia or Schizoaffective disorder in past: Yes  Duration of Psychotic Symptoms: Greater than six months  Hallucinations:Hallucinations: Auditory Description of  Auditory Hallucinations: "voices in the house"  Ideas of Reference:Delusions; Paranoia  Suicidal Thoughts:Suicidal Thoughts: No  Homicidal Thoughts:Homicidal Thoughts: No   Sensorium  Memory:Immediate Fair; Recent Fair  Judgment:Impaired  Insight:Lacking   Executive Functions  Concentration:Fair  Attention Span:Fair  Recall:Good  Fund of Knowledge:Good  Language:Good   Psychomotor Activity  Psychomotor Activity:Psychomotor Activity: Normal   Assets  Assets:Communication Skills; Financial Resources/Insurance; Intimacy; Housing; Leisure Time; Physical Health; Resilience; Social Support   Sleep  Sleep:Sleep: Good Number of Hours of Sleep: 7   Nutritional Assessment (For OBS and FBC admissions only) Has the patient had a weight loss or gain of 10 pounds or more in the last 3 months?: No Has the patient had a decrease in food intake/or appetite?: No Does the patient have dental problems?: No Does the patient have eating habits or behaviors that may be indicators of an eating disorder including binging or inducing vomiting?: No Has the patient recently lost weight without trying?: 0 Has the patient been eating poorly because of a decreased appetite?: 0 Malnutrition Screening Tool Score: 0    Physical Exam Vitals and nursing note reviewed.  Constitutional:      Appearance: Normal appearance. He is well-developed.  HENT:     Head: Normocephalic and atraumatic.     Nose: Nose normal.  Cardiovascular:     Rate and Rhythm: Normal rate.  Pulmonary:     Effort: Pulmonary effort is normal.  Musculoskeletal:          General: Normal range of motion.     Cervical back: Normal range of motion.  Skin:    General: Skin is warm and dry.  Neurological:     Mental Status: He is alert and oriented to person, place, and time.  Psychiatric:        Attention and Perception: He perceives auditory hallucinations.        Mood and Affect: Mood is anxious. Affect is labile.         Speech: Speech is tangential.        Behavior: Behavior is cooperative.        Thought Content: Thought content is paranoid and delusional.        Cognition and Memory: Memory normal.   Review of Systems  Constitutional: Negative.   HENT: Negative.    Eyes: Negative.   Respiratory: Negative.    Cardiovascular: Negative.   Gastrointestinal: Negative.   Genitourinary: Negative.   Musculoskeletal: Negative.   Skin: Negative.   Neurological: Negative.   Psychiatric/Behavioral:  Positive for hallucinations. The patient is nervous/anxious.     Blood pressure 136/81, pulse 94, temperature 98 F (36.7 C), temperature source Oral, resp. rate 18, SpO2 99 %. There is no height or weight on file to calculate BMI.  Past Psychiatric History: Schizophreniform disorder, paranoid ideations  Is the patient at risk to self? No  Has the patient been a risk to self in the past 6 months? No .    Has the patient been a risk to self within the distant past? No   Is the patient a risk to others? No   Has the patient been a risk to others in the past 6 months? No   Has the patient been a risk to others within the distant past? No   Past Medical History:  Past Medical History:  Diagnosis Date   Insomnia    Subdural hematoma (HCC)     Past Surgical History:  Procedure Laterality Date   HAND SURGERY      Family History:  Family History  Problem Relation Age of Onset   Healthy Mother     Social History:  Social History   Socioeconomic History   Marital status: Single    Spouse name: Not on file   Number of children: Not on file   Years of education: Not on file   Highest education level: Not on file  Occupational History   Not on file  Tobacco Use   Smoking status: Former    Packs/day: 0.50    Types: Cigarettes   Smokeless tobacco: Never  Vaping Use   Vaping Use: Never used  Substance and Sexual Activity   Alcohol use: Yes    Comment: occasionally   Drug use: Not Currently     Types: Marijuana   Sexual activity: Yes    Birth control/protection: Condom    Comment: occasional condom use  Other Topics Concern   Not on file  Social History Narrative   Not on file   Social Determinants of Health   Financial Resource Strain: Not on file  Food Insecurity: Not on file  Transportation Needs: Not on file  Physical Activity: Not on file  Stress: Not on file  Social Connections: Not on file  Intimate Partner Violence: Not on file    SDOH:  SDOH Screenings   Alcohol Screen: Low Risk  (07/30/2018)   Alcohol Screen    Last Alcohol Screening Score (AUDIT): 1  Depression (PHQ2-9): Low Risk  (  11/25/2021)   Depression (PHQ2-9)    PHQ-2 Score: 0  Financial Resource Strain: Not on file  Food Insecurity: Not on file  Housing: Not on file  Physical Activity: Not on file  Social Connections: Not on file  Stress: Not on file  Tobacco Use: Medium Risk (11/25/2021)   Patient History    Smoking Tobacco Use: Former    Smokeless Tobacco Use: Never    Passive Exposure: Not on file  Transportation Needs: Not on file    Last Labs:  Appointment on 11/10/2021  Component Date Value Ref Range Status   Hep C Virus Ab 11/10/2021 Non Reactive  Non Reactive Final   Comment: HCV antibody alone does not differentiate between previously resolved infection and active infection. Equivocal and Reactive HCV antibody results should be followed up with an HCV RNA test to support the diagnosis of active HCV infection.    HIV Screen 4th Generation wRfx 11/10/2021 Non Reactive  Non Reactive Final   Comment: HIV Negative HIV-1/HIV-2 antibodies and HIV-1 p24 antigen were NOT detected. There is no laboratory evidence of HIV infection.    Mycoplasma genitalium NAA 11/10/2021 Negative  Negative Final   Mycoplasma hominis NAA 11/10/2021 Positive (A)  Negative Final   Ureaplasma spp NAA 11/10/2021 Positive (A)  Negative Final   Chlamydia trachomatis, NAA 11/10/2021 Negative  Negative Final    Neisseria gonorrhoeae, NAA 11/10/2021 Negative  Negative Final   TSH 11/10/2021 1.190  0.450 - 4.500 uIU/mL Final   Cholesterol, Total 11/10/2021 249 (H)  100 - 199 mg/dL Final   Triglycerides 11/10/2021 150 (H)  0 - 149 mg/dL Final   HDL 11/10/2021 33 (L)  >39 mg/dL Final   VLDL Cholesterol Cal 11/10/2021 28  5 - 40 mg/dL Final   LDL Chol Calc (NIH) 11/10/2021 188 (H)  0 - 99 mg/dL Final   Chol/HDL Ratio 11/10/2021 7.5 (H)  0.0 - 5.0 ratio Final   Comment:                                   T. Chol/HDL Ratio                                             Men  Women                               1/2 Avg.Risk  3.4    3.3                                   Avg.Risk  5.0    4.4                                2X Avg.Risk  9.6    7.1                                3X Avg.Risk 23.4   11.0    Hgb A1c MFr Bld 11/10/2021 5.4  4.8 - 5.6 % Final   Comment:          Prediabetes:  5.7 - 6.4          Diabetes: >6.4          Glycemic control for adults with diabetes: <7.0    Est. average glucose Bld gHb Est-m* 11/10/2021 108  mg/dL Final   Glucose 11/10/2021 92  70 - 99 mg/dL Final   BUN 11/10/2021 14  6 - 20 mg/dL Final   Creatinine, Ser 11/10/2021 1.27  0.76 - 1.27 mg/dL Final   eGFR 11/10/2021 77  >59 mL/min/1.73 Final   BUN/Creatinine Ratio 11/10/2021 11  9 - 20 Final   Sodium 11/10/2021 143  134 - 144 mmol/L Final   Potassium 11/10/2021 4.1  3.5 - 5.2 mmol/L Final   Chloride 11/10/2021 107 (H)  96 - 106 mmol/L Final   CO2 11/10/2021 22  20 - 29 mmol/L Final   Calcium 11/10/2021 9.7  8.7 - 10.2 mg/dL Final   Total Protein 11/10/2021 6.7  6.0 - 8.5 g/dL Final   Albumin 11/10/2021 4.3  4.1 - 5.1 g/dL Final                 **Please note reference interval change**   Globulin, Total 11/10/2021 2.4  1.5 - 4.5 g/dL Final   Albumin/Globulin Ratio 11/10/2021 1.8  1.2 - 2.2 Final   Bilirubin Total 11/10/2021 0.3  0.0 - 1.2 mg/dL Final   Alkaline Phosphatase 11/10/2021 61  44 - 121 IU/L Final   AST  11/10/2021 17  0 - 40 IU/L Final   ALT 11/10/2021 14  0 - 44 IU/L Final   WBC 11/10/2021 4.5  3.4 - 10.8 x10E3/uL Final   RBC 11/10/2021 5.60  4.14 - 5.80 x10E6/uL Final   Hemoglobin 11/10/2021 15.4  13.0 - 17.7 g/dL Final   Hematocrit 11/10/2021 46.8  37.5 - 51.0 % Final   MCV 11/10/2021 84  79 - 97 fL Final   MCH 11/10/2021 27.5  26.6 - 33.0 pg Final   MCHC 11/10/2021 32.9  31.5 - 35.7 g/dL Final   RDW 11/10/2021 13.2  11.6 - 15.4 % Final   Platelets 11/10/2021 328  150 - 450 x10E3/uL Final   Neutrophils 11/10/2021 57  Not Estab. % Final   Lymphs 11/10/2021 31  Not Estab. % Final   Monocytes 11/10/2021 8  Not Estab. % Final   Eos 11/10/2021 3  Not Estab. % Final   Basos 11/10/2021 1  Not Estab. % Final   Neutrophils Absolute 11/10/2021 2.6  1.4 - 7.0 x10E3/uL Final   Lymphocytes Absolute 11/10/2021 1.4  0.7 - 3.1 x10E3/uL Final   Monocytes Absolute 11/10/2021 0.3  0.1 - 0.9 x10E3/uL Final   EOS (ABSOLUTE) 11/10/2021 0.1  0.0 - 0.4 x10E3/uL Final   Basophils Absolute 11/10/2021 0.0  0.0 - 0.2 x10E3/uL Final   Immature Granulocytes 11/10/2021 0  Not Estab. % Final   Immature Grans (Abs) 11/10/2021 0.0  0.0 - 0.1 x10E3/uL Final  Admission on 07/23/2021, Discharged on 07/24/2021  Component Date Value Ref Range Status   SARS Coronavirus 2 by RT PCR 07/23/2021 NEGATIVE  NEGATIVE Final   Comment: (NOTE) SARS-CoV-2 target nucleic acids are NOT DETECTED.  The SARS-CoV-2 RNA is generally detectable in upper respiratory specimens during the acute phase of infection. The lowest concentration of SARS-CoV-2 viral copies this assay can detect is 138 copies/mL. A negative result does not preclude SARS-Cov-2 infection and should not be used as the sole basis for treatment or other patient management decisions. A negative result may occur with    improper specimen collection/handling, submission of specimen other than nasopharyngeal swab, presence of viral mutation(s) within the areas targeted by  this assay, and inadequate number of viral copies(<138 copies/mL). A negative result must be combined with clinical observations, patient history, and epidemiological information. The expected result is Negative.  Fact Sheet for Patients:  https://www.fda.gov/media/152166/download  Fact Sheet for Healthcare Providers:  https://www.fda.gov/media/152162/download  This test is no                          t yet approved or cleared by the [place] FDA and  has been authorized for detection and/or diagnosis of SARS-CoV-2 by FDA under an Emergency Use Authorization (EUA). This EUA will remain  in effect (meaning this test can be used) for the duration of the COVID-19 declaration under Section 564(b)(1) of the Act, 21 U.S.C.section 360bbb-3(b)(1), unless the authorization is terminated  or revoked sooner.       Influenza A by PCR 07/23/2021 NEGATIVE  NEGATIVE Final   Influenza B by PCR 07/23/2021 NEGATIVE  NEGATIVE Final   Comment: (NOTE) The Xpert Xpress SARS-CoV-2/FLU/RSV plus assay is intended as an aid in the diagnosis of influenza from Nasopharyngeal swab specimens and should not be used as a sole basis for treatment. Nasal washings and aspirates are unacceptable for Xpert Xpress SARS-CoV-2/FLU/RSV testing.  Fact Sheet for Patients: https://www.fda.gov/media/152166/download  Fact Sheet for Healthcare Providers: https://www.fda.gov/media/152162/download  This test is not yet approved or cleared by the [place] FDA and has been authorized for detection and/or diagnosis of SARS-CoV-2 by FDA under an Emergency Use Authorization (EUA). This EUA will remain in effect (meaning this test can be used) for the duration of the COVID-19 declaration under Section 564(b)(1) of the Act, 21 U.S.C. section 360bbb-3(b)(1), unless the authorization is terminated or revoked.  Performed at [facility], [address] [place], [place] [address]    Sodium 07/23/2021 139  135 -  145 mmol/L Final   Potassium 07/23/2021 3.4 (L)  3.5 - 5.1 mmol/L Final   Chloride 07/23/2021 108  98 - 111 mmol/L Final   CO2 07/23/2021 23  22 - 32 mmol/L Final   Glucose, Bld 07/23/2021 121 (H)  70 - 99 mg/dL Final   Glucose reference range applies only to samples taken after fasting for at least 8 hours.   BUN 07/23/2021 10  6 - 20 mg/dL Final   Creatinine, Ser 07/23/2021 1.09  0.61 - 1.24 mg/dL Final   Calcium 07/23/2021 9.0  8.9 - 10.3 mg/dL Final   Total Protein 07/23/2021 7.3  6.5 - 8.1 g/dL Final   Albumin 07/23/2021 4.0  3.5 - 5.0 g/dL Final   AST 07/23/2021 20  15 - 41 U/L Final   ALT 07/23/2021 23  0 - 44 U/L Final   Alkaline Phosphatase 07/23/2021 47  38 - 126 U/L Final   Total Bilirubin 07/23/2021 0.3  0.3 - 1.2 mg/dL Final   GFR, Estimated 07/23/2021 >60  >60 mL/min Final   Comment: (NOTE) Calculated using the CKD-EPI Creatinine Equation (2021)    Anion gap 07/23/2021 8  5 - 15 Final   Performed at [facility], [address] [place], [place] [address]   Alcohol, Ethyl (B) 07/23/2021 42 (H)  <10 mg/dL Final   Comment: (NOTE) Lowest detectable limit for serum alcohol is 10 mg/dL.  For medical purposes only. Performed at [facility], [address] [place], [place] [address]      Opiates 07/23/2021 NONE DETECTED  NONE DETECTED Final   Cocaine 07/23/2021 NONE DETECTED  NONE DETECTED Final   Benzodiazepines 07/23/2021 NONE DETECTED  NONE DETECTED Final   Amphetamines 07/23/2021 NONE DETECTED  NONE DETECTED Final   Tetrahydrocannabinol 07/23/2021 NONE DETECTED  NONE DETECTED Final   Barbiturates 07/23/2021 NONE DETECTED  NONE DETECTED Final   Comment: (NOTE) DRUG SCREEN FOR MEDICAL PURPOSES ONLY.  IF CONFIRMATION IS NEEDED FOR ANY PURPOSE, NOTIFY LAB WITHIN 5 DAYS.  LOWEST DETECTABLE LIMITS FOR URINE DRUG SCREEN Drug Class                     Cutoff (ng/mL) Amphetamine and metabolites    1000 Barbiturate and metabolites    200 Benzodiazepine                  200 Tricyclics and metabolites     300 Opiates and metabolites        300 Cocaine and metabolites        300 THC                            50 Performed at [facility], [address] [place], [place] [address]    WBC 07/23/2021 5.2  4.0 - 10.5 K/uL Final   RBC 07/23/2021 5.49  4.22 - 5.81 MIL/uL Final   Hemoglobin 07/23/2021 14.9  13.0 - 17.0 g/dL Final   HCT 07/23/2021 45.6  39.0 - 52.0 % Final   MCV 07/23/2021 83.1  80.0 - 100.0 fL Final   MCH 07/23/2021 27.1  26.0 - 34.0 pg Final   MCHC 07/23/2021 32.7  30.0 - 36.0 g/dL Final   RDW 07/23/2021 12.9  11.5 - 15.5 % Final   Platelets 07/23/2021 298  150 - 400 K/uL Final   nRBC 07/23/2021 0.0  0.0 - 0.2 % Final   Neutrophils Relative % 07/23/2021 60  % Final   Neutro Abs 07/23/2021 3.1  1.7 - 7.7 K/uL Final   Lymphocytes Relative 07/23/2021 32  % Final   Lymphs Abs 07/23/2021 1.7  0.7 - 4.0 K/uL Final   Monocytes Relative 07/23/2021 7  % Final   Monocytes Absolute 07/23/2021 0.4  0.1 - 1.0 K/uL Final   Eosinophils Relative 07/23/2021 1  % Final   Eosinophils Absolute 07/23/2021 0.0  0.0 - 0.5 K/uL Final   Basophils Relative 07/23/2021 0  % Final   Basophils Absolute 07/23/2021 0.0  0.0 - 0.1 K/uL Final   Immature Granulocytes 07/23/2021 0  % Final   Abs Immature Granulocytes 07/23/2021 0.02  0.00 - 0.07 K/uL Final   Performed at [facility], [address] [place], [place] [address]   Salicylate Lvl 07/23/2021 <7.0 (L)  7.0 - 30.0 mg/dL Final   Performed at [facility], [address] [place], [place] [address]   Acetaminophen (Tylenol), Serum 07/23/2021 <10 (L)  10 - 30 ug/mL Final   Comment: (NOTE) Therapeutic concentrations vary significantly. A range of 10-30 ug/mL  may be an effective concentration for many patients. However, some  are best treated at concentrations outside of this range. Acetaminophen concentrations >150 ug/mL at 4 hours after ingestion  and >50 ug/mL at 12 hours after  ingestion are often associated with  toxic reactions.  Performed at  Hospital Lab, [address] , [place] [address]    Neisseria Gonorrhea 07/23/2021 Negative   Final     Chlamydia 07/23/2021 Negative   Final   Comment 07/23/2021 Normal Reference Ranger Chlamydia - Negative   Final   Comment 07/23/2021 Normal Reference Range Neisseria Gonorrhea - Negative   Final    Allergies: Patient has no known allergies.  PTA Medications: (Not in a hospital admission)   Medical Decision Making  Patient reviewed with Dr. Hampton Abbot.  Patient remains under involuntary commitment.  Inpatient psychiatric treatment recommended.  Laboratory studies ordered including CBC, CMP, ethanol, A1c, hepatic function, lipid panel, magnesium, prolactin and TSH.  Urine drug screen order initiated.  EKG ordered.  Current medications: -Acetaminophen 650 mg every 6 as needed/mild pain -Maalox 30 mL oral every 4 as needed/digestion -Magnesium hydroxide 30 mL daily as needed/mild constipation -Trazodone 50 mg nightly as needed/sleep -Olanzapine Zydis 10 mg nightly/mood  Agitation protocol initiated including: -Olanzapine Zydis 5 mg every 8 hours as needed/agitation -Lorazepam 1 mg as needed as needed/anxiety or severe agitation for 1 dose -Ziprasidone 20 mg IM as needed/agitation for 1 dose    Recommendations  Based on my evaluation the patient does not appear to have an emergency medical condition.  Patient remains under involuntary commitment petition, upheld by this Probation officer.  Patient will be admitted to observation area while awaiting inpatient psychiatric treatment.  Lucky Rathke, FNP 12/21/21  2:45 PM

## 2021-12-21 NOTE — ED Notes (Signed)
Pt searched and brought onto the unit.  He was oriented to milieu.   Given juice.  Pt currently is awake and alert. He has blunted affect and appears to be talking to himself.   Pt is calm and redirectable at this time.   Staff will monitor for safety.

## 2021-12-22 LAB — PROLACTIN: Prolactin: 18.1 ng/mL — ABNORMAL HIGH (ref 4.0–15.2)

## 2021-12-22 NOTE — Progress Notes (Addendum)
Pt was accepted to [facility]; 5th Floor of the [facility] at [facility]  Pt meets inpatient criteria per Doran Heater, FNP  Attending Physician will be Dr. Lorraine Lax   Report can be called to: [phone]   Pt can arrive after: Bed is ready  Care Team notified: Doran Heater, FNP  Please send IVC paperwork to [phone]  Kelton Pillar, LCSWA 12/22/2021 @ 1:11 PM

## 2021-12-22 NOTE — ED Notes (Signed)
Confirmation received for IVC paperwork.

## 2021-12-22 NOTE — ED Notes (Signed)
IVC paperwork faxed to [phone], waiting on confirmation

## 2021-12-22 NOTE — ED Provider Notes (Signed)
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  He is seated in observation area upon my approach, no apparent distress.  He is alert and oriented, cooperative during assessment.  Patient presents with anxious mood, congruent affect.  James [name] continues to endorse paranoid ideations.  He states "it is not me, it is the people in the speak boxes, there are speak boxes in the court house and the police officers have them, I heard the nurses in different people talking in them."  Patient continues to report concern regarding his children states "I want to be in possession of the children instead of them being with their mom, could you get a detective to look over the notes you have been written about what I said? " Patient conversation tangential, states "A lot of people are getting time in prison, eight years in maximum security."  He denies suicidal and homicidal ideations.  He denies auditory and visual hallucinations at this time.  Patient endorses average sleep and appetite.  James [name] remains under involuntary commitment.  Verbalizes understanding of treatment plan to include inpatient psychiatric hospitalization at [facility]. Reiterates "there is nothing wrong with me but I will go ahead and go."  Stay Summary: HPI from 12/21/2021 at 1142am: [facility], transported via Patent examinerlaw enforcement.  Involuntary commitment, initiated by patient's mother, [name], reads: "Petitioner states: The respondent has been diagnosed schizophrenia, bipolar disorder.  The respondent has not been  eating, sleeping or attending to personal hygiene.  The respondent says voices talk to him and tell him to hurt others then the voices have told the respondent to kill me.  Respondent says voices in the wall talk to him all day long and tell him to do things.  The respondent screams at people he does not know.  Yesterday he sent the police to his kids grandma's house with false accusations of rape/molestation by her boyfriend.  The respondent has been using social media to threaten people."   Patient is assessed, face-to-face, by nurse practitioner.  He is seated in assessment area, no apparent distress.  He is alert and oriented, pleasant and cooperative during assessment.  Patient presents with anxious mood, congruent affect.   James [name] endorses apparent paranoid ideations and delusions.  He states "people keep talking to me, the [place] [facility] and the coach of the [employer] have money saved up and they are trying to move my family so that we can get a new house that does not have voices in it."   Patient endorses auditory hallucinations, "I hear voices, they are in the walls." No visual hallucinations reported, denies command hallucinations.    Patient reports "people are trying to move my family out of our apartment and they are practicing voodoo and soul ties on my mother and my kids."  Patient reports he has contacted Patent examinerlaw enforcement both yesterday and today related to "sexual stuff" that unidentified entities are planning to perform on his children.  Patient reports on last school year he "walked around the school every day to make sure they are not putting voodoo on my  kids."  Patient reports multiple people would like to attempt to kill him "in real life."   Additionally he believes conversations on his cell phone are recorded and social media is tracking his behavior.  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  He has also been diagnosed with traumatic brain injury.  He is not linked with outpatient psychiatry currently.  He is not currently prescribed any medications per his report.  He reports history of being prescribed medication to address anxiety however "I stopped because it caused weight gain, I read online."    He last had medication to address his mood approximately 2 months ago.  He endorses multiple previous inpatient psychiatric hospitalizations.  Most recently he reports spending 18 days at [facility] in April 2023.  He would prefer not to return to [facility] because "they like to try to practice on people."   He has also been hospitalized at [facility] in 2019, he prefers not to return to [facility] because "gang members works there and they put drugs in your food and drink there."   James [name] resides in [place] with his mother and 2 brothers.  His children ages 51 years old and 70 years old reside with their mother and maternal grandmother.  He denies access to weapons.  He is not currently employed.  He denies alcohol and substance use. Patient endorses average sleep and appetite, per petitioner, patient "not sleeping."   Patient denies suicidal and homicidal ideations at this time. Denies threatening others via social media.    Treatment plan to include inpatient psychiatric hospitalization.  Reviewed medication, olanzapine 10 mg nightly, reviewed side effects and offered patient opportunity to ask questions.  Patient verbalizes understanding of treatment plan.   Patient offered support and encouragement.  He gives verbal consent to speak with his mother, Gala Murdoch.  Attempted to reach patient's mother, Gala Murdoch, phone number [phone].  Unable to leave message,  voicemail full.   Total Time spent with patient: 30 minutes  Past Psychiatric History: Schizophreniform disorder Past Medical History:  Past Medical History:  Diagnosis Date   Insomnia    Subdural hematoma (HCC)     Past Surgical History:  Procedure Laterality Date   HAND SURGERY     Family History:  Family History  Problem Relation Age of Onset   Healthy Mother    Family Psychiatric History: None reported Social History:  Social History   Substance and Sexual Activity  Alcohol Use Yes   Comment: occasionally     Social History   Substance and Sexual Activity  Drug Use Not Currently   Types: Marijuana    Social History   Socioeconomic History   Marital status: Single    Spouse name: Not on file   Number of children: Not on file   Years of education: Not on file   Highest education level: Not on file  Occupational History   Not on file  Tobacco Use   Smoking status: Former    Packs/day: 0.50    Types: Cigarettes   Smokeless tobacco: Never  Vaping Use   Vaping Use: Never used  Substance and Sexual Activity   Alcohol use: Yes    Comment: occasionally   Drug use: Not Currently  Types: Marijuana   Sexual activity: Yes    Birth control/protection: Condom    Comment: occasional condom use  Other Topics Concern   Not on file  Social History Narrative   Not on file   Social Determinants of Health   Financial Resource Strain: Not on file  Food Insecurity: Not on file  Transportation Needs: Not on file  Physical Activity: Not on file  Stress: Not on file  Social Connections: Not on file   SDOH:  SDOH Screenings   Alcohol Screen: Low Risk  (07/30/2018)   Alcohol Screen    Last Alcohol Screening Score (AUDIT): 1  Depression (PHQ2-9): Medium Risk (12/21/2021)   Depression (PHQ2-9)    PHQ-2 Score: 5  Financial Resource Strain: Not on file  Food Insecurity: Not on file  Housing: Not on file  Physical Activity: Not on file  Social Connections: Not  on file  Stress: Not on file  Tobacco Use: Medium Risk (11/25/2021)   Patient History    Smoking Tobacco Use: Former    Smokeless Tobacco Use: Never    Passive Exposure: Not on file  Transportation Needs: Not on file    Tobacco Cessation:  A prescription for an FDA-approved tobacco cessation medication was offered at discharge and the patient refused  Current Medications:  Current Facility-Administered Medications  Medication Dose Route Frequency Provider Last Rate Last Admin   acetaminophen (TYLENOL) tablet 650 mg  650 mg Oral Q6H PRN Lenard Lance, FNP       alum & mag hydroxide-simeth (MAALOX/MYLANTA) 200-200-20 MG/5ML suspension 30 mL  30 mL Oral Q4H PRN Lenard Lance, FNP       OLANZapine zydis (ZYPREXA) disintegrating tablet 5 mg  5 mg Oral Q8H PRN Lenard Lance, FNP       And   LORazepam (ATIVAN) tablet 1 mg  1 mg Oral PRN Lenard Lance, FNP       And   ziprasidone (GEODON) injection 20 mg  20 mg Intramuscular PRN Lenard Lance, FNP       magnesium hydroxide (MILK OF MAGNESIA) suspension 30 mL  30 mL Oral Daily PRN Lenard Lance, FNP       OLANZapine zydis (ZYPREXA) disintegrating tablet 10 mg  10 mg Oral QHS Lenard Lance, FNP   10 mg at 12/21/21 2159   traZODone (DESYREL) tablet 50 mg  50 mg Oral QHS PRN Lenard Lance, FNP       Current Outpatient Medications  Medication Sig Dispense Refill   OLANZapine (ZYPREXA) 20 MG tablet Take 1 tablet (20 mg total) by mouth at bedtime. (Patient not taking: Reported on 12/21/2021) 30 tablet 1    PTA Medications: (Not in a hospital admission)      12/21/2021    1:09 PM 11/25/2021    9:21 AM 08/18/2021    3:06 PM  Depression screen PHQ 2/9  Decreased Interest 0 0 0  Down, Depressed, Hopeless 0 0 0  PHQ - 2 Score 0 0 0  Altered sleeping 1 0   Tired, decreased energy 0 0   Change in appetite 0 0   Feeling bad or failure about yourself  1 0   Trouble concentrating 1 0   Moving slowly or fidgety/restless 2 0   Suicidal thoughts 0 0    PHQ-9 Score 5 0   Difficult doing work/chores Somewhat difficult Not difficult at all     Flowsheet Row ED from 12/21/2021 in [facility]  Center ED from 07/23/2021 in [facility] EMERGENCY DEPARTMENT ED from 01/06/2021 in [facility] at [facility] RISK CATEGORY No Risk No Risk No Risk       Musculoskeletal  Strength & Muscle Tone: within normal limits Gait & Station: normal Patient leans: N/A  Psychiatric Specialty Exam  Presentation  General Appearance: Casual  Eye Contact:Fair  Speech:Clear and Coherent; Normal Rate  Speech Volume:Normal  Handedness:Right   Mood and Affect  Mood:Anxious  Affect:Congruent   Thought Process  Thought Processes:Coherent  Descriptions of Associations:Tangential  Orientation:Full (Time, Place and Person)  Thought Content:Tangential; Paranoid Ideation  Diagnosis of Schizophrenia or Schizoaffective disorder in past: Yes  Duration of Psychotic Symptoms: Greater than six months   Hallucinations:Hallucinations: None Description of Auditory Hallucinations: "voices in the house"  Ideas of Reference:Paranoia  Suicidal Thoughts:Suicidal Thoughts: No  Homicidal Thoughts:Homicidal Thoughts: No   Sensorium  Memory:Immediate Fair  Judgment:Impaired  Insight:Lacking   Executive Functions  Concentration:Fair  Attention Span:Fair  Recall:Good  Fund of Knowledge:Good  Language:Good   Psychomotor Activity  Psychomotor Activity:Psychomotor Activity: Normal   Assets  Assets:Communication Skills; Financial Resources/Insurance; Housing; Intimacy; Leisure Time; Physical Health; Resilience; Social Support   Sleep  Sleep:Sleep: Good Number of Hours of Sleep: 7   Nutritional Assessment (For OBS and FBC admissions only) Has the patient had a weight loss or gain of 10 pounds or more in the last 3 months?: No Has the patient had a decrease in food intake/or appetite?:  No Does the patient have dental problems?: No Does the patient have eating habits or behaviors that may be indicators of an eating disorder including binging or inducing vomiting?: No Has the patient recently lost weight without trying?: 0 Has the patient been eating poorly because of a decreased appetite?: 0 Malnutrition Screening Tool Score: 0    Physical Exam  Physical Exam Vitals and nursing note reviewed.  Constitutional:      Appearance: Normal appearance. He is well-developed and normal weight.  HENT:     Head: Normocephalic and atraumatic.     Nose: Nose normal.  Cardiovascular:     Rate and Rhythm: Normal rate.  Pulmonary:     Effort: Pulmonary effort is normal.  Musculoskeletal:        General: Normal range of motion.     Cervical back: Normal range of motion.  Skin:    General: Skin is warm and dry.  Neurological:     Mental Status: He is alert and oriented to person, place, and time.   Review of Systems  Constitutional: Negative.   HENT: Negative.    Eyes: Negative.   Respiratory: Negative.    Cardiovascular: Negative.   Gastrointestinal: Negative.   Genitourinary: Negative.   Musculoskeletal: Negative.   Skin: Negative.   Neurological: Negative.   Psychiatric/Behavioral:  The patient is nervous/anxious.    Blood pressure (!) 111/48, pulse 73, temperature 97.9 F (36.6 C), temperature source Oral, resp. rate 20, SpO2 100 %. There is no height or weight on file to calculate BMI.  Demographic Factors:  Male  Loss Factors: NA  Historical Factors: NA  Risk Reduction Factors:   Sense of responsibility to family, Living with another person, especially a relative, Positive social support, Positive therapeutic relationship, and Positive coping skills or problem solving skills  Continued Clinical Symptoms:  Previous Psychiatric Diagnoses and Treatments  Cognitive Features That Contribute To Risk:    Suicide Risk:    Plan Of Care/Follow-up  recommendations:  Reviewed with Dr. Nelly Rout.  He remains under involuntary commitment.  Inpatient psychiatric treatment recommended.  Disposition: Patient accepted to [facility] hospital by Dr. Lorraine Lax.  Lenard Lance, FNP 12/22/2021, 1:42 PM

## 2021-12-22 NOTE — Progress Notes (Signed)
Inpatient Behavioral Health Placement  Pt meets inpatient criteria per ,Doran Heater, FNP. There are no available beds at [facility] per [facility] Fransico Michael, RN Referral was sent to the following facilities;   Destination Service Provider Address Phone Fax  [facility]  [address] [place] [address] [phone]  [facility]  [address] [place] [address] [phone]  [facility]  [address] [place], [place] [address] [phone]  [facility]  [address] [place] [address] [phone]  [facility]  [address] [place] [address] [phone]  [facility]  [address] [place] [address] [phone]  [facility]  [address], [place] [address] [phone]  [facility]  [address], [place] [address] [phone]  [facility]  [address], [place] [address] [phone]  [facility]  [address] [place] [address] [phone]  [facility]  [address] [place] [address] [phone]    Situation ongoing,  CSW will follow up.   Maryjean Ka, MSW, [facility] 12/22/2021  @ 12:34 AM

## 2021-12-22 NOTE — ED Notes (Signed)
Nonemergency police called for transportation to [facility].

## 2021-12-22 NOTE — ED Notes (Signed)
Patient was discharged to [facility]. Patient was an IVC patient. Patient was transported by the sheriff department. Patient has been calm and cooperative throughout the shift. Writer gave report to [place],Y, Charity fundraiser.

## 2021-12-22 NOTE — ED Notes (Signed)
Remains asleep no distressed or disturbed sleep patterns noted James [name] appears comfortable.

## 2021-12-26 DIAGNOSIS — F200 Paranoid schizophrenia: Secondary | ICD-10-CM | POA: Insufficient documentation

## 2022-03-27 ENCOUNTER — Encounter (HOSPITAL_BASED_OUTPATIENT_CLINIC_OR_DEPARTMENT_OTHER): Payer: Self-pay | Admitting: Family Medicine

## 2022-03-27 ENCOUNTER — Ambulatory Visit (INDEPENDENT_AMBULATORY_CARE_PROVIDER_SITE_OTHER): Payer: Commercial Managed Care - HMO | Admitting: Family Medicine

## 2022-03-27 VITALS — BP 123/81 | HR 65 | Temp 97.6°F | Ht 70.0 in | Wt 201.8 lb

## 2022-03-27 DIAGNOSIS — F2081 Schizophreniform disorder: Secondary | ICD-10-CM | POA: Diagnosis not present

## 2022-03-27 DIAGNOSIS — R030 Elevated blood-pressure reading, without diagnosis of hypertension: Secondary | ICD-10-CM | POA: Diagnosis not present

## 2022-03-27 NOTE — Progress Notes (Signed)
    Procedures performed today:    None.  Independent interpretation of notes and tests performed by another provider:   None.  Brief History, Exam, Impression, and Recommendations:    BP 123/81 (BP Location: Left Arm, Patient Position: Sitting, Cuff Size: Large)   Pulse 65   Temp 97.6 F (36.4 C) (Oral)   Ht 5\' 10"  (1.778 m)   Wt 201 lb 12.8 oz (91.5 kg)   SpO2 100%   BMI 28.96 kg/m   Elevated blood pressure reading without diagnosis of hypertension Blood pressure is at goal in office today.  Patient denies any issues with chest pain or headaches.  He has not been checking blood pressure at home. Recommend intermittent monitoring of blood pressure at home, DASH diet, handout provided today.  We will continue to monitor blood pressure closely in the office  Schizophreniform disorder [facility]) Patient had inpatient psychiatric admission about 3 months ago.  At that time, he had medication changes made and was advised to follow-up as an outpatient with [facility] behavioral health.  He reports that he called at office and was advised that he did not need to follow-up.  Discussed with patient today that given his psychiatric history, requirement of inpatient treatment, medication changes that were made, feel that he would certainly benefit from outpatient follow-up and provided patient with office information today for [facility] behavioral health.  Recommend that he arrange for visit with their office in the near future.  Return in about 6 months (around 09/26/2022) for BP.   ___________________________________________ Lizabeth Fellner de 11/26/2022, MD, ABFM, [facility] Primary Care and Sports Medicine [facility]

## 2022-03-27 NOTE — Assessment & Plan Note (Addendum)
Blood pressure is at goal in office today.  Patient denies any issues with chest pain or headaches.  He has not been checking blood pressure at home. Recommend intermittent monitoring of blood pressure at home, DASH diet, handout provided today.  We will continue to monitor blood pressure closely in the office

## 2022-03-27 NOTE — Assessment & Plan Note (Signed)
Patient had inpatient psychiatric admission about 3 months ago.  At that time, he had medication changes made and was advised to follow-up as an outpatient with [facility] behavioral health.  He reports that he called at office and was advised that he did not need to follow-up.  Discussed with patient today that given his psychiatric history, requirement of inpatient treatment, medication changes that were made, feel that he would certainly benefit from outpatient follow-up and provided patient with office information today for [facility] behavioral health.  Recommend that he arrange for visit with their office in the near future.

## 2022-03-27 NOTE — Patient Instructions (Signed)
  Medication Instructions:  Your physician recommends that you continue on your current medications as directed. Please refer to the Current Medication list given to you today. --If you need a refill on any your medications before your next appointment, please call your pharmacy first. If no refills are authorized on file call the office.-- Lab Work: Your physician has recommended that you have lab work today: No If you have labs (blood work) drawn today and your tests are completely normal, you will receive your results via MyChart message OR a phone call from our staff.  Please ensure you check your voicemail in the event that you authorized detailed messages to be left on a delegated number. If you have any lab test that is abnormal or we need to change your treatment, we will call you to review the results.  Referrals/Procedures/Imaging: No  Follow-Up: Your next appointment:   Your physician recommends that you schedule a follow-up appointment in: 4-6 months with Dr. de [place].  Clinical Follow-up (MH and/or SA Services):  Name of Provider Agency Referred: [facility] Date/Time of Appointment: "Open Access" Monday-Thursday 7:30 AM to 11 AM Phone: [phone] Address: [address] [place] Reason: Outpatient Mental Health Treatment   You will receive a text message or e-mail with a link to a survey about your care and experience with [place] today! We would greatly appreciate your feedback!   Thanks for letting us be apart of your health journey!!  Primary Care and Sports Medicine   Dr. Ceasar Mons Peru   We encourage you to activate your patient portal called "MyChart".  Sign up information is provided on this After Visit Summary.  MyChart is used to connect with patients for Virtual Visits (Telemedicine).  Patients are able to view lab/test results, encounter notes, upcoming appointments, etc.  Non-urgent messages can be sent to your provider as well. To learn  more about what you can do with MyChart, please visit --  [email or web address].

## 2022-09-08 ENCOUNTER — Ambulatory Visit (HOSPITAL_COMMUNITY)
Admission: EM | Admit: 2022-09-08 | Discharge: 2022-09-08 | Disposition: A | Discharge status: Other Acute Inpatient Hospital | Payer: Medicaid Other | Attending: Psychiatry | Admitting: Psychiatry

## 2022-09-08 ENCOUNTER — Encounter (HOSPITAL_COMMUNITY): Payer: Self-pay | Admitting: Emergency Medicine

## 2022-09-08 ENCOUNTER — Emergency Department (HOSPITAL_COMMUNITY): Payer: Commercial Managed Care - HMO

## 2022-09-08 ENCOUNTER — Emergency Department (HOSPITAL_COMMUNITY)
Admission: EM | Admit: 2022-09-08 | Discharge: 2022-09-08 | Disposition: A | Discharge status: Other Acute Inpatient Hospital | Payer: Commercial Managed Care - HMO | Attending: Emergency Medicine | Admitting: Emergency Medicine

## 2022-09-08 ENCOUNTER — Ambulatory Visit (INDEPENDENT_AMBULATORY_CARE_PROVIDER_SITE_OTHER)
Admission: EM | Admit: 2022-09-08 | Discharge: 2022-09-10 | Disposition: A | Discharge status: Home / Self Care | Payer: Commercial Managed Care - HMO | Source: Home / Self Care

## 2022-09-08 ENCOUNTER — Other Ambulatory Visit: Payer: Self-pay

## 2022-09-08 DIAGNOSIS — F201 Disorganized schizophrenia: Secondary | ICD-10-CM | POA: Insufficient documentation

## 2022-09-08 DIAGNOSIS — Z046 Encounter for general psychiatric examination, requested by authority: Secondary | ICD-10-CM

## 2022-09-08 DIAGNOSIS — R443 Hallucinations, unspecified: Secondary | ICD-10-CM | POA: Insufficient documentation

## 2022-09-08 DIAGNOSIS — S62304A Unspecified fracture of fourth metacarpal bone, right hand, initial encounter for closed fracture: Secondary | ICD-10-CM

## 2022-09-08 DIAGNOSIS — G4700 Insomnia, unspecified: Secondary | ICD-10-CM | POA: Insufficient documentation

## 2022-09-08 DIAGNOSIS — F319 Bipolar disorder, unspecified: Secondary | ICD-10-CM | POA: Diagnosis not present

## 2022-09-08 DIAGNOSIS — S6991XA Unspecified injury of right wrist, hand and finger(s), initial encounter: Secondary | ICD-10-CM | POA: Diagnosis present

## 2022-09-08 DIAGNOSIS — S62364A Nondisplaced fracture of neck of fourth metacarpal bone, right hand, initial encounter for closed fracture: Secondary | ICD-10-CM | POA: Diagnosis not present

## 2022-09-08 DIAGNOSIS — X838XXA Intentional self-harm by other specified means, initial encounter: Secondary | ICD-10-CM | POA: Insufficient documentation

## 2022-09-08 DIAGNOSIS — I4510 Unspecified right bundle-branch block: Secondary | ICD-10-CM | POA: Insufficient documentation

## 2022-09-08 DIAGNOSIS — Z79899 Other long term (current) drug therapy: Secondary | ICD-10-CM | POA: Insufficient documentation

## 2022-09-08 DIAGNOSIS — Z87820 Personal history of traumatic brain injury: Secondary | ICD-10-CM | POA: Insufficient documentation

## 2022-09-08 DIAGNOSIS — F209 Schizophrenia, unspecified: Secondary | ICD-10-CM | POA: Insufficient documentation

## 2022-09-08 DIAGNOSIS — S62344A Nondisplaced fracture of base of fourth metacarpal bone, right hand, initial encounter for closed fracture: Secondary | ICD-10-CM | POA: Diagnosis not present

## 2022-09-08 LAB — ACETAMINOPHEN LEVEL: Acetaminophen (Tylenol), Serum: <10 ug/mL — ABNORMAL LOW (ref 10–30)

## 2022-09-08 LAB — COMPREHENSIVE METABOLIC PANEL
ALT: 25 U/L (ref 0–44)
AST: 37 U/L (ref 15–41)
Albumin: 3.4 g/dL — ABNORMAL LOW (ref 3.5–5.0)
Alkaline Phosphatase: 50 U/L (ref 38–126)
Anion gap: 10 (ref 5–15)
BUN: 12 mg/dL (ref 6–20)
CO2: 22 mmol/L (ref 22–32)
Calcium: 8.4 mg/dL — ABNORMAL LOW (ref 8.9–10.3)
Chloride: 109 mmol/L (ref 98–111)
Creatinine, Ser: 1.27 mg/dL — ABNORMAL HIGH (ref 0.61–1.24)
GFR, Estimated: >60 mL/min (ref >60)
Glucose, Bld: 94 mg/dL (ref 70–99)
Potassium: 3.7 mmol/L (ref 3.5–5.1)
Sodium: 141 mmol/L (ref 135–145)
Total Bilirubin: 0.8 mg/dL (ref 0.3–1.2)
Total Protein: 6.1 g/dL — ABNORMAL LOW (ref 6.5–8.1)

## 2022-09-08 LAB — SALICYLATE LEVEL: Salicylate Lvl: <7 mg/dL — ABNORMAL LOW (ref 7.0–30.0)

## 2022-09-08 LAB — CBC
HCT: 45.4 % (ref 39.0–52.0)
Hemoglobin: 14.8 g/dL (ref 13.0–17.0)
MCH: 27.5 pg (ref 26.0–34.0)
MCHC: 32.6 g/dL (ref 30.0–36.0)
MCV: 84.4 fL (ref 80.0–100.0)
Platelets: 332 10*3/uL (ref 150–400)
RBC: 5.38 MIL/uL (ref 4.22–5.81)
RDW: 13.1 % (ref 11.5–15.5)
WBC: 6.1 10*3/uL (ref 4.0–10.5)
nRBC: 0 % (ref 0.0–0.2)

## 2022-09-08 LAB — RAPID URINE DRUG SCREEN, HOSP PERFORMED
Amphetamines: NOT DETECTED
Barbiturates: NOT DETECTED
Benzodiazepines: NOT DETECTED
Cocaine: NOT DETECTED
Opiates: NOT DETECTED
Tetrahydrocannabinol: NOT DETECTED

## 2022-09-08 LAB — ETHANOL: Alcohol, Ethyl (B): <10 mg/dL (ref <10)

## 2022-09-08 MED ORDER — THIAMINE HCL 100 MG/ML IJ SOLN
100.0000 mg | Freq: Once | INTRAMUSCULAR | Status: DC
  Filled 2022-09-08: qty 2

## 2022-09-08 MED ORDER — PALIPERIDONE ER 3 MG PO TB24: 3.0000 mg | ORAL_TABLET | Freq: Every day | ORAL | Status: DC

## 2022-09-08 MED ORDER — ACETAMINOPHEN 325 MG PO TABS
650.0000 mg | ORAL_TABLET | Freq: Four times a day (QID) | ORAL | Status: DC | PRN
  Administered 2022-09-08 – 2022-09-10 (×6): 650 mg via ORAL
  Filled 2022-09-08 (×6): qty 2

## 2022-09-08 MED ORDER — ONDANSETRON 4 MG PO TBDP: 4.0000 mg | ORAL_TABLET | Freq: Four times a day (QID) | ORAL | Status: DC | PRN

## 2022-09-08 MED ORDER — ADULT MULTIVITAMIN W/MINERALS CH
1.0000 | ORAL_TABLET | Freq: Every day | ORAL | Status: DC
  Filled 2022-09-08 (×2): qty 1

## 2022-09-08 MED ORDER — ZIPRASIDONE MESYLATE 20 MG IM SOLR: 20.0000 mg | Freq: Two times a day (BID) | INTRAMUSCULAR | Status: DC | PRN

## 2022-09-08 MED ORDER — THIAMINE MONONITRATE 100 MG PO TABS
100.0000 mg | ORAL_TABLET | Freq: Every day | ORAL | Status: DC
  Filled 2022-09-08: qty 1

## 2022-09-08 MED ORDER — HYDROXYZINE HCL 25 MG PO TABS: 25.0000 mg | ORAL_TABLET | Freq: Four times a day (QID) | ORAL | Status: DC | PRN

## 2022-09-08 MED ORDER — HYDROXYZINE HCL 25 MG PO TABS: 25.0000 mg | ORAL_TABLET | Freq: Three times a day (TID) | ORAL | Status: DC | PRN

## 2022-09-08 MED ORDER — LOPERAMIDE HCL 2 MG PO CAPS: 2.0000 mg | ORAL_CAPSULE | ORAL | Status: DC | PRN

## 2022-09-08 MED ORDER — TRAZODONE HCL 50 MG PO TABS: 50.0000 mg | ORAL_TABLET | Freq: Every evening | ORAL | Status: DC | PRN

## 2022-09-08 MED ORDER — ALUM & MAG HYDROXIDE-SIMETH 200-200-20 MG/5ML PO SUSP: 30.0000 mL | ORAL | Status: DC | PRN

## 2022-09-08 MED ORDER — LORAZEPAM 1 MG PO TABS: 1.0000 mg | ORAL_TABLET | ORAL | Status: DC | PRN

## 2022-09-08 MED ORDER — LORAZEPAM 1 MG PO TABS: 1.0000 mg | ORAL_TABLET | Freq: Four times a day (QID) | ORAL | Status: DC | PRN

## 2022-09-08 MED ORDER — MAGNESIUM HYDROXIDE 400 MG/5ML PO SUSP: 30.0000 mL | Freq: Every day | ORAL | Status: DC | PRN

## 2022-09-08 NOTE — ED Triage Notes (Addendum)
Pt arrives via [employer] from [facility] under IVC with reports of right hand pain. PT reports hitting the hand on a door frame. Right hand is swollen. IVC papers reports pt has schizophrenia and bipolar. Pt is not taking his medications. Pt is hallucinating and hearing voices that are telling him to kill himself. Pt denies SI/HI at this time.
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
## 2022-09-08 NOTE — Progress Notes (Signed)
LCSW Progress Note  [id number]   ALEXANDERJAMES IDA  09/08/2022  7:53 PM    Inpatient Behavioral Health Placement  Pt meets inpatient criteria per Hillery Jacks, NP. There are no available beds within [facility] per [facility] Rosey Bath, RN. Referral was sent to the following facilities;    Destination  Service Provider Address Phone  Fax  [facility]  [address] [place] [address] [phone]  [facility] [address]  [address] [place] [address] [phone]  [facility] [address] [place]  [address] [place], [place] [address] [phone]  [facility]  [address] [place] [address] [phone]  [facility]  [address] [place] [address] [phone]  [facility]  [address] [place], [place] [address] [phone]  [facility]  [address] [place] [address] [phone]  [facility]  [address] [place] [address] [phone]  [facility]  [address] [place] [address] [phone]  [facility]  [address] [place] [address] [phone]  [facility]  [address], [place] [address] [phone]  [facility]  [address], [place] [address] [phone]  [facility]  [address] [place] [address] [phone]  [facility]  [address] [place]., [place] [address] [phone]  [facility]  [address] [place] [address] [phone]  [facility]  [address], [place] [address] [phone]  [facility]  [address] [place], [place] [address] [phone]  [facility]  [address] [place] [address] [phone]  [facility]  [address] [place] [address] [phone]  [facility]  [phone] [place] [address] [phone]   Situation ongoing,  CSW will follow up.    Maryjean Ka, MSW, [facility] 09/08/2022 7:53 PM

## 2022-09-08 NOTE — ED Notes (Signed)
Patient in milieu. Environment is secured. Will continue to monitor for safety. 
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
## 2022-09-08 NOTE — ED Notes (Signed)
Patient was sent to [facility]ced to get medical clearance due to  his rt hand being swollen. Rn gave report to [employer]Notified rn that patient was ivc'd.Patient was transported by [facility]
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
## 2022-09-08 NOTE — ED Notes (Signed)
No sitters available at this time 

## 2022-09-08 NOTE — ED Notes (Signed)
Pt changed into purple scrubs 

## 2022-09-08 NOTE — ED Notes (Signed)
Cell phone, crocks shorts and under shirt returned to pt, pt states that this is all of his belongings, pt has less than three sec cap refill in hand, positive sensation to touch, pt states that he has no needs at this time, pt ambulatory from dpt with pt to [facility].

## 2022-09-08 NOTE — ED Notes (Addendum)
Pt admitted to obs denies SI/HI/AVH. Calm, cooperative throughout interview process. Skin assessment completed. Oriented to unit. Meal and drink offered. At currrent, pt continue to deniesSI/HI/AVH. Pt verbally contract for safety. Will monitor for safety. Patient is very delusional. States someone is trying to harm him and his family.Items are in locker 11

## 2022-09-08 NOTE — ED Notes (Signed)
Patient refused his thiamine and multivitamin. Rn notified provider
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
## 2022-09-08 NOTE — ED Notes (Signed)
Rn notified provider that patients blood pressure was 153/133.States to take a manual blood pressure. Rn notified on coming shift. Patient denies any s/s of hypertension.

## 2022-09-08 NOTE — ED Notes (Signed)
Pt sleeping@this time. Breathing even and unlabored. Will continue to monitor for safety 

## 2022-09-08 NOTE — ED Notes (Addendum)
Provider dc'd labs due to to him having labs at [facility]

## 2022-09-08 NOTE — Discharge Instructions (Addendum)
Patient to be transferred to [facility] emergency department for medical clearance

## 2022-09-08 NOTE — Discharge Instructions (Signed)
This type of fracture usually heals by itself, keep the splint on unless you are in the shower, you may take Tylenol or ibuprofen as needed for pain

## 2022-09-08 NOTE — ED Notes (Signed)
Rn notified providers that patients was coming back from ed with an elevated bp

## 2022-09-09 DIAGNOSIS — F201 Disorganized schizophrenia: Secondary | ICD-10-CM | POA: Diagnosis not present

## 2022-09-09 NOTE — ED Notes (Signed)
Pt is awake and alert this morning. Denies HI, SI or AVH. But pt has been observed talking to himself and appears at times to be having AH.   Pt was given breakfast and informed that a provider would talk to him this morning.  Staff will cont to monitor for safety.

## 2022-09-09 NOTE — Discharge Instructions (Addendum)
You may call the [facility] outpatient clinic to confirm medicaid status prior to presenting to the walk-in clinic to ensure nothing is else is needed to establish care.   You will take oral pill of Invega 6 mg daily at bedtime for 7 days.  You received an [place] injection 156 mg today. You will need another injection in one week. Due to the holiday, return here and go the outpatient clinic upstairs on 09/19/2022 @ 715 to establish for medication management and receive your second Invega injection 156 mg. Once established in the outpatient clinic you will return here monthly to receive medication management treatment by mental health provider that you are established with.
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
## 2022-09-09 NOTE — ED Notes (Signed)
Pt taken outside for some fresh air.  He remained calm and redirectable.

## 2022-09-09 NOTE — ED Notes (Signed)
Pt sleeping@this time. breathing even and unlabored. Will continue to monitor for safety 

## 2022-09-09 NOTE — ED Notes (Signed)
Patient A&Ox4. Patient denies SI/HI and AVH. Patient denies any physical complaints when asked. No acute distress noted. Support and encouragement provided. Routine safety checks conducted according to facility protocol. Encouraged patient to notify staff if thoughts of harm toward self or others arise. Patient verbalize understanding and agreement. Will continue to monitor for safety.    

## 2022-09-10 DIAGNOSIS — S62364A Nondisplaced fracture of neck of fourth metacarpal bone, right hand, initial encounter for closed fracture: Secondary | ICD-10-CM | POA: Diagnosis not present

## 2022-09-10 DIAGNOSIS — F201 Disorganized schizophrenia: Secondary | ICD-10-CM | POA: Diagnosis not present

## 2022-09-10 LAB — HIV ANTIBODY (ROUTINE TESTING W REFLEX): HIV Screen 4th Generation wRfx: NONREACTIVE

## 2022-09-10 MED ORDER — HYDROXYZINE HCL 25 MG PO TABS: 25.0000 mg | ORAL_TABLET | Freq: Three times a day (TID) | ORAL | 0 refills | Status: DC | PRN

## 2022-09-10 MED ORDER — PALIPERIDONE PALMITATE ER 156 MG/ML IM SUSY
156.0000 mg | PREFILLED_SYRINGE | Freq: Once | INTRAMUSCULAR | Status: AC
  Administered 2022-09-10: 156 mg via INTRAMUSCULAR
  Filled 2022-09-10: qty 1

## 2022-09-10 MED ORDER — TRAZODONE HCL 50 MG PO TABS: 50.0000 mg | ORAL_TABLET | Freq: Every evening | ORAL | 0 refills | Status: DC | PRN

## 2022-09-10 MED ORDER — NAPROXEN 500 MG PO TABS: 500.0000 mg | ORAL_TABLET | Freq: Two times a day (BID) | ORAL | Status: DC

## 2022-09-10 MED ORDER — PALIPERIDONE ER 6 MG PO TB24: 6.0000 mg | ORAL_TABLET | Freq: Every day | ORAL | 0 refills | Status: DC

## 2022-09-10 MED ORDER — NAPROXEN 500 MG PO TABS
500.0000 mg | ORAL_TABLET | Freq: Two times a day (BID) | ORAL | Status: DC
  Administered 2022-09-10: 500 mg via ORAL
  Filled 2022-09-10: qty 1

## 2022-09-10 NOTE — ED Notes (Signed)
Patient refused multivitamin and thiamine. Rn notfied provider
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
## 2022-09-10 NOTE — ED Notes (Signed)
Patient requested pain medication for his hand

## 2022-09-10 NOTE — ED Notes (Signed)
Patient resting quietly in bed with eyes open, Respirations equal and unlabored, skin warm and dry, NAD. No change in assessment or acuity. Routine safety checks conducted according to facility protocol. Will continue to monitor for safety.   

## 2022-09-10 NOTE — ED Notes (Signed)
Patient did not have a 1st exam done provider states that it does not have  to be done due to ivc being resended,

## 2022-09-10 NOTE — ED Notes (Signed)
Invega shot lot [id number] Exp [phone]

## 2022-09-10 NOTE — ED Notes (Signed)
Patient requested pain medication for hand

## 2022-09-10 NOTE — ED Notes (Signed)
Patients IVC was resened per provider.

## 2022-09-10 NOTE — ED Notes (Signed)
I called for lab pick up 11:05

## 2022-09-10 NOTE — ED Notes (Signed)
Patient alert and oriented x 3. Denies SI/HI/AVH. Denies intent or plan to harm self or others. Routine conducted according to faculty protocol. Encourage patient to notify staff with any needs or concerns. Patient verbalized agreement and understanding. Will continue to monitor for safety. 

## 2022-09-10 NOTE — ED Notes (Signed)
Patient A&O x 4, ambulatory. Patient discharged in no acute distress. Patient denied SI/HI, A/VH upon discharge. Patient verbalized understanding of all discharge instructions explained by staff, to include follow up appointments, RX's and safety plan. Pt belongings returned to patient from locker #11 intact. Patient escorted to lobby via staff for transport to destination. Safety maintained.  

## 2022-09-10 NOTE — ED Notes (Signed)
Patient in milieu. Environment is secured. Will continue to monitor for safety. 

## 2022-09-19 ENCOUNTER — Ambulatory Visit (INDEPENDENT_AMBULATORY_CARE_PROVIDER_SITE_OTHER): Payer: Commercial Managed Care - HMO | Admitting: Psychiatry

## 2022-09-19 VITALS — BP 150/94 | HR 87

## 2022-09-19 DIAGNOSIS — Z789 Other specified health status: Secondary | ICD-10-CM

## 2022-09-19 DIAGNOSIS — R030 Elevated blood-pressure reading, without diagnosis of hypertension: Secondary | ICD-10-CM

## 2022-09-19 DIAGNOSIS — F203 Undifferentiated schizophrenia: Secondary | ICD-10-CM | POA: Diagnosis not present

## 2022-09-19 MED ORDER — PALIPERIDONE PALMITATE ER 156 MG/ML IM SUSY
156.0000 mg | PREFILLED_SYRINGE | INTRAMUSCULAR | Status: DC
Start: 2022-09-19 — End: 2022-09-19

## 2022-09-19 MED ORDER — PALIPERIDONE PALMITATE ER 156 MG/ML IM SUSY
156.0000 mg | PREFILLED_SYRINGE | Freq: Once | INTRAMUSCULAR | Status: DC
Start: 2022-09-20 — End: 2022-11-08

## 2022-09-19 MED ORDER — PALIPERIDONE PALMITATE ER 156 MG/ML IM SUSY
234.0000 mg | PREFILLED_SYRINGE | INTRAMUSCULAR | Status: DC
Start: 2022-10-21 — End: 2022-11-08
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
## 2022-09-20 ENCOUNTER — Ambulatory Visit (HOSPITAL_COMMUNITY): Payer: 59

## 2022-09-23 ENCOUNTER — Encounter (HOSPITAL_COMMUNITY): Payer: Self-pay | Admitting: Psychiatry

## 2022-09-26 ENCOUNTER — Encounter (HOSPITAL_BASED_OUTPATIENT_CLINIC_OR_DEPARTMENT_OTHER): Payer: Self-pay | Admitting: Family Medicine

## 2022-09-26 ENCOUNTER — Ambulatory Visit (INDEPENDENT_AMBULATORY_CARE_PROVIDER_SITE_OTHER): Payer: Commercial Managed Care - HMO | Admitting: Family Medicine

## 2022-09-26 ENCOUNTER — Ambulatory Visit (INDEPENDENT_AMBULATORY_CARE_PROVIDER_SITE_OTHER): Payer: Commercial Managed Care - HMO

## 2022-09-26 VITALS — BP 147/99 | HR 89 | Ht 70.0 in | Wt 206.0 lb

## 2022-09-26 DIAGNOSIS — F2081 Schizophreniform disorder: Secondary | ICD-10-CM | POA: Diagnosis not present

## 2022-09-26 DIAGNOSIS — R030 Elevated blood-pressure reading, without diagnosis of hypertension: Secondary | ICD-10-CM | POA: Diagnosis not present

## 2022-09-26 DIAGNOSIS — S62364A Nondisplaced fracture of neck of fourth metacarpal bone, right hand, initial encounter for closed fracture: Secondary | ICD-10-CM | POA: Diagnosis not present

## 2022-09-26 DIAGNOSIS — S62309A Unspecified fracture of unspecified metacarpal bone, initial encounter for closed fracture: Secondary | ICD-10-CM | POA: Insufficient documentation

## 2022-09-26 NOTE — Patient Instructions (Signed)
  Medication Instructions:  Your physician recommends that you continue on your current medications as directed. Please refer to the Current Medication list given to you today. --If you need a refill on any your medications before your next appointment, please call your pharmacy first. If no refills are authorized on file call the office.-- Lab Work: Your physician has recommended that you have lab work today: No If you have labs (blood work) drawn today and your tests are completely normal, you will receive your results via MyChart message OR a phone call from our staff.  Please ensure you check your voicemail in the event that you authorized detailed messages to be left on a delegated number. If you have any lab test that is abnormal or we need to change your treatment, we will call you to review the results.  Referrals/Procedures/Imaging: Yes  Follow-Up: Your next appointment:   Your physician recommends that you schedule a follow-up appointment in: 2-3 weeks with Dr. de [place].  You will receive a text message or e-mail with a link to a survey about your care and experience with [place] today! We would greatly appreciate your feedback!   Thanks for letting us be apart of your health journey!!  Primary Care and Sports Medicine   Dr. Ceasar Mons Peru   We encourage you to activate your patient portal called "MyChart".  Sign up information is provided on this After Visit Summary.  MyChart is used to connect with patients for Virtual Visits (Telemedicine).  Patients are able to view lab/test results, encounter notes, upcoming appointments, etc.  Non-urgent messages can be sent to your provider as well. To learn more about what you can do with MyChart, please visit --  [email or web address].

## 2022-09-26 NOTE — Progress Notes (Signed)
    Procedures performed today:    None.  Independent interpretation of notes and tests performed by another provider:   None.  Brief History, Exam, Impression, and Recommendations:    BP (!) 151/101 (BP Location: Left Arm, Patient Position: Sitting, Cuff Size: Normal)   Pulse 89   Ht 5\' 10"  (1.778 m)   Wt 206 lb (93.4 kg)   SpO2 100%   BMI 29.56 kg/m   Elevated blood pressure reading without diagnosis of hypertension Blood pressure elevated on initial reading today.  Recheck did show slight improvement.  At last visit, blood pressure was better controlled. Recommend intermittent monitoring of blood pressure at home, DASH diet.  We will need to continue monitoring blood pressure closely determine need for pharmacotherapy to assist with better control  Metacarpal bone fracture Patient recently seen in the emergency department related to right hand pain.  Imaging revealed suspected cortical defect and current fracture of fourth metacarpal neck.  He has been wearing gutter splint for treatment.  Still has mild discomfort in the area.  Has been wearing splint as instructed. On exam, patient does have some tenderness to palpation in the area of observed suspected fracture on imaging.  Skin integrity is intact. Recommend proceeding with follow-up imaging today, can continue with use of splint for further 2 to 3 weeks.  Will plan to follow-up at that time to assess progress, plan to complete updated imaging at next office visit as well.  If healing appropriately, to allow for progression to range of motion exercises at that time and would no longer need to utilize splint after next office visit  Schizophreniform disorder [facility]) Patient continues to follow-up with psychiatry.  Has had poor medication compliance in the past and thus is being managed with long-acting injectable medication.  Recommend continued follow-up with psychiatry for management  Return in about 3 weeks (around 10/17/2022)  for right hand fracture.   ___________________________________________ Toribio Seiber de [place], MD, ABFM, CAQSM Primary Care and Sports Medicine [facility]

## 2022-09-26 NOTE — Assessment & Plan Note (Signed)
Patient continues to follow-up with psychiatry.  Has had poor medication compliance in the past and thus is being managed with long-acting injectable medication.  Recommend continued follow-up with psychiatry for management

## 2022-09-26 NOTE — Assessment & Plan Note (Signed)
Patient recently seen in the emergency department related to right hand pain.  Imaging revealed suspected cortical defect and current fracture of fourth metacarpal neck.  He has been wearing gutter splint for treatment.  Still has mild discomfort in the area.  Has been wearing splint as instructed. On exam, patient does have some tenderness to palpation in the area of observed suspected fracture on imaging.  Skin integrity is intact. Recommend proceeding with follow-up imaging today, can continue with use of splint for further 2 to 3 weeks.  Will plan to follow-up at that time to assess progress, plan to complete updated imaging at next office visit as well.  If healing appropriately, to allow for progression to range of motion exercises at that time and would no longer need to utilize splint after next office visit

## 2022-09-26 NOTE — Assessment & Plan Note (Signed)
Blood pressure elevated on initial reading today.  Recheck did show slight improvement.  At last visit, blood pressure was better controlled. Recommend intermittent monitoring of blood pressure at home, DASH diet.  We will need to continue monitoring blood pressure closely determine need for pharmacotherapy to assist with better control

## 2022-09-29 ENCOUNTER — Encounter (HOSPITAL_COMMUNITY): Payer: Self-pay

## 2022-09-29 ENCOUNTER — Ambulatory Visit (INDEPENDENT_AMBULATORY_CARE_PROVIDER_SITE_OTHER): Payer: Commercial Managed Care - HMO

## 2022-09-29 VITALS — BP 126/104 | HR 70 | Ht 70.0 in | Wt 207.8 lb

## 2022-09-29 DIAGNOSIS — F411 Generalized anxiety disorder: Secondary | ICD-10-CM

## 2022-09-29 DIAGNOSIS — G4700 Insomnia, unspecified: Secondary | ICD-10-CM

## 2022-09-29 DIAGNOSIS — F200 Paranoid schizophrenia: Secondary | ICD-10-CM

## 2022-09-29 NOTE — Progress Notes (Cosign Needed)
  PATIENT PRESENTS TO THE OFFICE FOR INVEGA SUSTENNA  INJECTION  234, WAS GIVEN BY Beyonce Sawatzky IN THE LEFT DELTOID WITH NO COMPLAINTS PT TOLERATED WELL. AND WILL RETURN 28 DAYS

## 2022-10-18 ENCOUNTER — Ambulatory Visit (HOSPITAL_BASED_OUTPATIENT_CLINIC_OR_DEPARTMENT_OTHER): Payer: Commercial Managed Care - HMO | Admitting: Family Medicine

## 2022-10-25 ENCOUNTER — Ambulatory Visit (HOSPITAL_COMMUNITY): Payer: 59

## 2022-11-06 DIAGNOSIS — S62344A Nondisplaced fracture of base of fourth metacarpal bone, right hand, initial encounter for closed fracture: Secondary | ICD-10-CM | POA: Insufficient documentation

## 2022-11-08 ENCOUNTER — Other Ambulatory Visit: Payer: Self-pay

## 2022-11-08 ENCOUNTER — Encounter (HOSPITAL_COMMUNITY): Payer: Self-pay

## 2022-11-08 ENCOUNTER — Ambulatory Visit (INDEPENDENT_AMBULATORY_CARE_PROVIDER_SITE_OTHER): Payer: Commercial Managed Care - HMO | Admitting: *Deleted

## 2022-11-08 ENCOUNTER — Ambulatory Visit (INDEPENDENT_AMBULATORY_CARE_PROVIDER_SITE_OTHER): Payer: Commercial Managed Care - HMO | Admitting: Student in an Organized Health Care Education/Training Program

## 2022-11-08 VITALS — BP 119/76 | HR 81 | Resp 16 | Ht 70.0 in | Wt 205.8 lb

## 2022-11-08 DIAGNOSIS — F203 Undifferentiated schizophrenia: Secondary | ICD-10-CM | POA: Diagnosis not present

## 2022-11-08 DIAGNOSIS — F200 Paranoid schizophrenia: Secondary | ICD-10-CM

## 2022-11-08 DIAGNOSIS — Z79899 Other long term (current) drug therapy: Secondary | ICD-10-CM | POA: Diagnosis not present

## 2022-11-08 MED ORDER — PALIPERIDONE PALMITATE ER 234 MG/1.5ML IM SUSY
234.0000 mg | PREFILLED_SYRINGE | INTRAMUSCULAR | 6 refills | Status: DC
Start: 2022-12-13 — End: 2022-11-20

## 2022-11-08 MED ORDER — PALIPERIDONE PALMITATE ER 156 MG/ML IM SUSY
156.0000 mg | PREFILLED_SYRINGE | Freq: Once | INTRAMUSCULAR | 0 refills | Status: DC
Start: 2022-11-14 — End: 2022-11-20
  Filled 2022-11-08: qty 1, 1d supply, fill #0
  Filled 2022-11-15 (×2): qty 1, 30d supply, fill #0

## 2022-11-08 MED ORDER — PALIPERIDONE PALMITATE ER 156 MG/ML IM SUSY
156.0000 mg | PREFILLED_SYRINGE | Freq: Once | INTRAMUSCULAR | Status: AC
  Administered 2022-11-08: 156 mg via INTRAMUSCULAR

## 2022-11-08 NOTE — Progress Notes (Addendum)
BH MD/PA/NP OP Progress Note  11/08/2022 4:04 PM EULIS SALAZAR  MRN:  [id number]  Chief Complaint:  Chief Complaint  Patient presents with   Follow-up   HPI: Patient is a 32 year old male with past psychiatric history of Schizophrenia, substance-induced psychotic disorder psychosis, brief psychotic disorder presented to the  outpatient clinic for [facility].    Last took hydrozine a few weeks ago and also completed his trazodone as well. Patient reports that he has been doing well, with the shot. He reports that he has not had AH, "hearing voices" since being on the injection. He denies VH. Patient denies SI and HI. Patient reports that he has been sleeping well, averaging 6h/ night. Patient reports that he feels well rested. Patient reports that he is averaging 1-10meals/ day. He reports that he has been feeling a little less hungry, but denies large fluctuations in weight. Patient denies feeling nervous, on edge, or constantly feeling irritable. Patient denies feeling like people are out to get him. Patient does endorse that he still believes that "someone" was trying to get information about him, but that the events that made him think this are not happening lately, (people coming up and saying certain things to him.)  Etoh: 2x/ week 3-4 shots/ time THC: denies Vape/ nicotine: denies Denies other illicit substances.   Visit Diagnosis:    ICD-10-CM   1. Undifferentiated schizophrenia (HCC)  F20.3 paliperidone (INVEGA SUSTENNA) 156 MG/ML SUSY injection    paliperidone (INVEGA SUSTENNA) 234 MG/1.5ML injection      Past Psychiatric History: Schizophrenia, substance-induced psychotic disorder psychosis, brief psychotic disorder  Prior inpatient treatment: 3 admissions. Most recent at [facility] on 09/10/22.  He had been admitted to [facility] and [facility]. His last [facility] admission was in 2020 when he was discharged on risperidone 6 mg nightly and trazodone.  Meds at 5/28 appt :" Invega Sustenna  156 mg got 1 week ago.  Invega PO 6 mg x 7 days, hydroxyzine 25 mg 3 times daily as needed for anxiety, trazodone 50 mg at bedtime as needed for insomnia.  Per 5/19 notes He was previously psychiatrically stabilized on Invega sustain 156 monthly and got invega sustaina 156 mg 2 months ago.  He was last following at [facility]. " Psychotherapy hx: none Previous suicidal attempts: He ran in front of traffic in 2020 due to hearing voices. Previous medication trials: Risperidone 6 mg nightly, [facility] Current therapist: None  Past Medical History:  Past Medical History:  Diagnosis Date   Insomnia    Subdural hematoma (HCC)     Past Surgical History:  Procedure Laterality Date   HAND SURGERY      Family Psychiatric History: Psych: None SA/HA: Denies  Family History:  Family History  Problem Relation Age of Onset   Healthy Mother     Social History:  Social History   Socioeconomic History   Marital status: Single    Spouse name: Not on file   Number of children: Not on file   Years of education: Not on file   Highest education level: Not on file  Occupational History   Not on file  Tobacco Use   Smoking status: Former    Current packs/day: 0.50    Types: Cigarettes   Smokeless tobacco: Never  Vaping Use   Vaping status: Never Used  Substance and Sexual Activity   Alcohol use: Yes    Comment: occasionally   Drug use: Not Currently    Types: Marijuana  Sexual activity: Yes    Birth control/protection: Condom    Comment: occasional condom use  Other Topics Concern   Not on file  Social History Narrative   Not on file   Social Determinants of Health   Financial Resource Strain: Not on file  Food Insecurity: No Food Insecurity (09/08/2022)   Hunger Vital Sign    Worried About Running Out of Food in the Last Year: Never true    Ran Out of Food in the Last Year: Never true  Transportation Needs: No Transportation Needs (09/08/2022)   PRAPARE -  Administrator, Civil Service (Medical): No    Lack of Transportation (Non-Medical): No  Physical Activity: Not on file  Stress: Not on file  Social Connections: Not on file    Allergies: No Known Allergies  Metabolic Disorder Labs: Lab Results  Component Value Date   HGBA1C 5.4 11/10/2021   MPG 105.41 07/31/2018   Lab Results  Component Value Date   PROLACTIN 18.1 (H) 12/21/2021   Lab Results  Component Value Date   CHOL 254 (H) 12/21/2021   TRIG 91 12/21/2021   HDL 36 (L) 12/21/2021   CHOLHDL 7.1 12/21/2021   VLDL 18 12/21/2021   LDLCALC 200 (H) 12/21/2021   LDLCALC 188 (H) 11/10/2021   Lab Results  Component Value Date   TSH 1.335 12/21/2021   TSH 1.190 11/10/2021    Therapeutic Level Labs: No results found for: "LITHIUM" No results found for: "VALPROATE" No results found for: "CBMZ"  Current Medications: Current Outpatient Medications  Medication Sig Dispense Refill   [START ON 11/14/2022] paliperidone (INVEGA SUSTENNA) 156 MG/ML SUSY injection Inject 1 mL (156 mg total) into the muscle once for 1 dose. 1 mL 0   [START ON 12/13/2022] paliperidone (INVEGA SUSTENNA) 234 MG/1.5ML injection Inject 234 mg into the muscle every 28 (twenty-eight) days. 1.5 mL 6   hydrOXYzine (ATARAX) 25 MG tablet Take 1 tablet (25 mg total) by mouth 3 (three) times daily as needed for anxiety. 30 tablet 0   paliperidone (INVEGA) 6 MG 24 hr tablet Take 1 tablet (6 mg total) by mouth at bedtime for 7 days. 7 tablet 0   traZODone (DESYREL) 50 MG tablet Take 1 tablet (50 mg total) by mouth at bedtime as needed for sleep. 30 tablet 0   Current Facility-Administered Medications  Medication Dose Route Frequency Provider Last Rate Last Admin   paliperidone (INVEGA SUSTENNA) injection 156 mg  156 mg Intramuscular Once Karsten Ro, MD       paliperidone (INVEGA SUSTENNA) injection 234 mg  234 mg Intramuscular Q30 days Karsten Ro, MD         Musculoskeletal: Strength & Muscle  Tone: within normal limits Gait & Station: normal Patient leans: N/A  Psychiatric Specialty Exam: Review of Systems  Psychiatric/Behavioral:  Negative for dysphoric mood, hallucinations, sleep disturbance and suicidal ideas.     There were no vitals taken for this visit.There is no height or weight on file to calculate BMI.  General Appearance: Casual  Eye Contact:  Good  Speech:  Clear and Coherent  Volume:  Normal  Mood:  Euthymic  Affect:  Appropriate  Thought Process:  Coherent  Orientation:  Full (Time, Place, and Person)  Thought Content: Logical   Suicidal Thoughts:  No  Homicidal Thoughts:  No  Memory:  Immediate;   Good Recent;   Good  Judgement:  Fair  Insight:  Shallow  Psychomotor Activity:  Normal  Concentration:  Concentration:  Fair  Recall:  NA  Fund of Knowledge: Fair  Language: Fair  Akathisia:  No  Handed:    AIMS (if indicated): done  Assets:  Desire for Improvement Resilience  ADL's:  Intact  Cognition: WNL  Sleep:  Good   Screenings: AIMS    Flowsheet Row Office Visit from 11/08/2022 in [facility] Admission (Discharged[facility]  AIMS Total Score 0 0      AUDIT    Flowsheet Row Admission (Discharged) from [facility]  Alcohol Use Disorder Identification Test Final Score (AUDIT) 1      GAD-7    Flowsheet Row Office Visit from 09/26/2022 in [facility] Primary Care & Sports Medicine at [facility] Office Visit from 03/27/2022 in [facility] Primary Care & Sports Medicine at [facility]  Total GAD-7 Score 0 0      PHQ2-9    Flowsheet Row Office Visit from 11/08/2022 in [facility] Office Visit from 09/26/2022 in [facility] Primary Care & Sports Medicine at [facility] Office Visit from 03/27/2022 in [facility] Primary Care & Sports  Medicine at [facility] ED from 12/21/2021 in [facility] Office Visit from 11/25/2021 in [facility] Primary Care & Sports Medicine at [facility]  PHQ-2 Total Score 1 0 1 0 0  PHQ-9 Total Score -- 0 1 5 0      Flowsheet Row Office Visit from 11/08/2022 in [facility] Most recent reading at 11/08/2022  3:44 PM ED from 09/08/2022 in [facility] Most recent reading at 09/10/2022  7:34 AM ED from 09/08/2022 in [facility] Emergency Department at [facility] Most recent reading at 09/08/2022  1:53 PM  C-SSRS RISK CATEGORY No Risk No Risk No Risk        Assessment and Plan: Patient will need to return in 1 week for a f/u 156mg /mL injection, due to being 7 weeks out from his last dose of 234mg . Patient endorsed some of issues with compliance due to cost, however, patient has insurance. Sent prescriptions to a new pharmacy that often deals with LAIs. Patient appears to have responded well to the 234mg  injection and recommend that he return to this dose, for his 11/2022 injection. Patient denies AH and is less preoccupied with his delusions and paranoid thoughts. Insight appears modestly improved given that he knows he has AH and some paranoia in the past prior to the injection. However, he continues to have some delusions that someone was out to get him.   Undifferentiated schizophrenia - 156mg /mL in 1 week, to address 7 week gap - Restart 234mg /mL dosing in 11/2022 - Discuss patient seeing PCP and getting updated labs  (Lipids, A1c, TSH), is seen at [facility] on [address] - If patient continues to have issues with compliance may want to consider Uzedy - Will dc Hydroxyzine and Trazodone due to no longer endorsing symptoms or need  Collaboration of Care: Collaboration of Care:   Patient/Guardian was advised Release of Information must be obtained prior to any record release in order to collaborate their  care with an outside provider. Patient/Guardian was advised if they have not already done so to contact the registration department to sign all necessary forms in order for [place] to release information regarding their care.   Consent: Patient/Guardian gives verbal consent for  treatment and assignment of benefits for services provided during this visit. Patient/Guardian expressed understanding and agreed to proceed.   PGY-4 Bobbye Morton, MD 11/08/2022, 4:04 PM

## 2022-11-08 NOTE — Progress Notes (Signed)
Patient arrived for injection of [place]. Noticed he has missed the last appointment and needs clarification on dosage. Eliseo Gum in to see patient to discuss medications. Order given for Invega 156mg , given in Right Deltoid without issues or complaints. Patient did not bring his medication stating that it cost too much. Medication was sent to [facility] pharmacy and patient will return in 7 days for next injection of Invega 156mg . He was made aware to bring his injection with him for next visit. No issues or complaints.

## 2022-11-09 DIAGNOSIS — Z79899 Other long term (current) drug therapy: Secondary | ICD-10-CM | POA: Insufficient documentation

## 2022-11-15 ENCOUNTER — Other Ambulatory Visit (HOSPITAL_COMMUNITY): Payer: Self-pay

## 2022-11-15 ENCOUNTER — Other Ambulatory Visit: Payer: Self-pay

## 2022-11-15 ENCOUNTER — Ambulatory Visit (HOSPITAL_COMMUNITY): Payer: Commercial Managed Care - HMO

## 2022-11-16 ENCOUNTER — Other Ambulatory Visit (HOSPITAL_COMMUNITY): Payer: Self-pay

## 2022-11-16 ENCOUNTER — Ambulatory Visit (HOSPITAL_COMMUNITY): Payer: 59

## 2022-11-17 ENCOUNTER — Other Ambulatory Visit: Payer: Self-pay

## 2022-11-17 ENCOUNTER — Other Ambulatory Visit (HOSPITAL_COMMUNITY): Payer: Self-pay

## 2022-11-20 ENCOUNTER — Ambulatory Visit (INDEPENDENT_AMBULATORY_CARE_PROVIDER_SITE_OTHER): Payer: Commercial Managed Care - HMO | Admitting: Psychiatry

## 2022-11-20 ENCOUNTER — Other Ambulatory Visit (HOSPITAL_COMMUNITY): Payer: Self-pay

## 2022-11-20 ENCOUNTER — Other Ambulatory Visit: Payer: Self-pay

## 2022-11-20 ENCOUNTER — Encounter (HOSPITAL_COMMUNITY): Payer: Self-pay | Admitting: Psychiatry

## 2022-11-20 DIAGNOSIS — F203 Undifferentiated schizophrenia: Secondary | ICD-10-CM | POA: Diagnosis not present

## 2022-11-20 MED ORDER — PALIPERIDONE PALMITATE ER 156 MG/ML IM SUSY
156.0000 mg | PREFILLED_SYRINGE | INTRAMUSCULAR | 11 refills | Status: DC
Start: 2022-11-20 — End: 2023-09-05
  Filled 2022-11-20 – 2022-12-06 (×2): qty 1, 28d supply, fill #0
  Filled 2022-12-22 – 2023-01-18 (×3): qty 1, 28d supply, fill #1
  Filled 2023-02-06: qty 1, 28d supply, fill #2
  Filled 2023-03-14: qty 1, 28d supply, fill #3
  Filled 2023-04-24: qty 1, 30d supply, fill #4
  Filled 2023-05-22: qty 1, 28d supply, fill #5
  Filled 2023-06-19: qty 1, 28d supply, fill #6
  Filled 2023-07-17: qty 1, 28d supply, fill #7
  Filled 2023-08-17: qty 1, 28d supply, fill #8

## 2022-11-20 NOTE — Progress Notes (Signed)
BH MD/PA/NP OP Progress Note Virtual Visit via Video Note  I connected with James [name] on 11/20/22 at 10:00 AM EDT by a video enabled telemedicine application and verified that I am speaking with the correct person using two identifiers.  Location: Patient: Home Provider: Clinic   I discussed the limitations of evaluation and management by telemedicine and the availability of in person appointments. The patient expressed understanding and agreed to proceed.  I provided 30 minutes of non-face-to-face time during this encounter.   11/20/2022 10:33 AM James [name]  MRN:  [id number]  Chief Complaint: "Will manage it should be billed to the clinic"  HPI: 32 year old male seen today for follow-up psychiatric evaluation.  His psychiatric history of substance-induced psychotic disorder, schizophreniform disorder, SI/SA, brief psychotic disorder, and psychosis.  Currently he is managed on Invega 156 mg monthly.  He reports that his medication is effective in managing his psychiatric conditions.  Today he is well-groomed, pleasant, cooperative, and engaged in conversation.  Patient informed Clinical research associate that he feels mentally stable and asked writer if his injection will be mailed to the clinic.  Provider confirmed with [facility] pharmacy that patient's medications will be shipped to the clinic.  Provider could do the things to the patient.  Patient reports that his mood is stable and notes that he has been more anxiety and depression.  Today provider conducted GAD-7 and patient scored a 0.  Provider also conducted PHQ-9 the patient scored a 1.  He endorses adequate sleep and appetite.  Patient informed writer that at times he becomes nauseous and vomits.  He notes that he believes this is due to his medications.  He denies weight loss.  Today he denies SI/HI/AVH, mania, paranoia.  No medication changes made today.  Patient agreeable to taking medications as prescribed. Patient will  receive his next injection on August 14th at 11:00. No other concerns noted at this time. Visit Diagnosis:    ICD-10-CM   1. Undifferentiated schizophrenia (HCC)  F20.3 paliperidone (INVEGA SUSTENNA) 156 MG/ML SUSY injection      Past Psychiatric History: Previous suicidal attempts: He ran in front of traffic in 2020 due to hearing voices. Substance-induced psychotic disorder, schizophreniform disorder, brief psychotic disorder, and psychosis.  Past Medical History:  Past Medical History:  Diagnosis Date   Insomnia    Subdural hematoma (HCC)     Past Surgical History:  Procedure Laterality Date   HAND SURGERY      Family Psychiatric History: Denies  Family History:  Family History  Problem Relation Age of Onset   Healthy Mother     Social History:  Social History   Socioeconomic History   Marital status: Single    Spouse name: Not on file   Number of children: Not on file   Years of education: Not on file   Highest education level: Not on file  Occupational History   Not on file  Tobacco Use   Smoking status: Former    Current packs/day: 0.50    Types: Cigarettes   Smokeless tobacco: Never  Vaping Use   Vaping status: Never Used  Substance and Sexual Activity   Alcohol use: Yes    Comment: occasionally   Drug use: Not Currently    Types: Marijuana   Sexual activity: Yes    Birth control/protection: Condom    Comment: occasional condom use  Other Topics Concern   Not on file  Social History Narrative   Not on file  Social Determinants of Health   Financial Resource Strain: Not on file  Food Insecurity: No Food Insecurity (09/08/2022)   Hunger Vital Sign    Worried About Running Out of Food in the Last Year: Never true    Ran Out of Food in the Last Year: Never true  Transportation Needs: No Transportation Needs (09/08/2022)   PRAPARE - Administrator, Civil Service (Medical): No    Lack of Transportation (Non-Medical): No  Physical  Activity: Not on file  Stress: Not on file  Social Connections: Not on file    Allergies: No Known Allergies  Metabolic Disorder Labs: Lab Results  Component Value Date   HGBA1C 5.4 11/10/2021   MPG 105.41 07/31/2018   Lab Results  Component Value Date   PROLACTIN 18.1 (H) 12/21/2021   Lab Results  Component Value Date   CHOL 254 (H) 12/21/2021   TRIG 91 12/21/2021   HDL 36 (L) 12/21/2021   CHOLHDL 7.1 12/21/2021   VLDL 18 12/21/2021   LDLCALC 200 (H) 12/21/2021   LDLCALC 188 (H) 11/10/2021   Lab Results  Component Value Date   TSH 1.335 12/21/2021   TSH 1.190 11/10/2021    Therapeutic Level Labs: No results found for: "LITHIUM" No results found for: "VALPROATE" No results found for: "CBMZ"  Current Medications: Current Outpatient Medications  Medication Sig Dispense Refill   paliperidone (INVEGA SUSTENNA) 156 MG/ML SUSY injection Inject 1 mL (156 mg total) into the muscle every 28 (twenty-eight) days. 1 mL 11   No current facility-administered medications for this visit.     Musculoskeletal: Strength & Muscle Tone: within normal limits and telehealth visit Gait & Station: normal, telehealth visit Patient leans: N/A  Psychiatric Specialty Exam: Review of Systems  There were no vitals taken for this visit.There is no height or weight on file to calculate BMI.  General Appearance: Well Groomed  Eye Contact:  Good  Speech:  Clear and Coherent and Normal Rate  Volume:  Normal  Mood:  Euthymic  Affect:  Appropriate and Congruent  Thought Process:  Coherent, Goal Directed, and NA  Orientation:  Full (Time, Place, and Person)  Thought Content: WDL and Logical   Suicidal Thoughts:  No  Homicidal Thoughts:  No  Memory:  Immediate;   Good Recent;   Good Remote;   Good  Judgement:  Good  Insight:  Good  Psychomotor Activity:  Normal  Concentration:  Concentration: Good and Attention Span: Good  Recall:  Good  Fund of Knowledge: Good  Language: Good   Akathisia:  No  Handed:  Right  AIMS (if indicated): not done  Assets:  Communication Skills Desire for Improvement Financial Resources/Insurance Housing Leisure Time Physical Health Social Support  ADL's:  Intact  Cognition: WNL  Sleep:  Good   Screenings: AIMS    Flowsheet Row Office Visit from 11/08/2022 in [facility] Admission (Discharged) from [facility]  AIMS Total Score 0 0      AUDIT    Flowsheet Row Admission (Discharged) from [facility]  Alcohol Use Disorder Identification Test Final Score (AUDIT) 1      GAD-7    Flowsheet Row [facility] Office Visit from 09/26/2022 in [facility] Primary Care & Sports Medicine at [facility] Office Visit from 03/27/2022 in [facility] Primary Care & Sports  Medicine at [facility]  Total GAD-7 Score 0 0 0      PHQ2-9    Flowsheet Row Office Visit from 11/20/2022 in [facility] Office Visit from 11/08/2022 in [facility] Office Visit from 09/26/2022 in [facility] Primary Care & Sports Medicine at [facility] Office Visit from 03/27/2022 in [facility] Primary Care & Sports Medicine at [facility] ED from 12/21/2021 in [facility]  PHQ-2 Total Score 0 1 0 1 0  PHQ-9 Total Score 1 -- 0 1 5      Flowsheet Row Office Visit from 11/20/2022 in [facility] Office Visit from 11/08/2022 in [facility] ED from 09/08/2022 in [facility]  C-SSRS RISK CATEGORY No Risk No Risk No Risk        Assessment and Plan: Patient reports that he is doing well on his current medication regimen.  No medication changes made today.  Patient agreeable to  continue medications as prescribed.  Patient will receive his next injection on August 14th at 11:00.  1. Undifferentiated schizophrenia (HCC)  Continue- paliperidone (INVEGA SUSTENNA) 156 MG/ML SUSY injection; Inject 1 mL (156 mg total) into the muscle every 28 (twenty-eight) days.  Dispense: 1 mL; Refill: 11   Collaboration of Care: Collaboration of Care: Other provider involved in patient's care AEB shot clinic staff and PCP  Patient/Guardian was advised Release of Information must be obtained prior to any record release in order to collaborate their care with an outside provider. Patient/Guardian was advised if they have not already done so to contact the registration department to sign all necessary forms in order for [place] to release information regarding their care.   Consent: Patient/Guardian gives verbal consent for treatment and assignment of benefits for services provided during this visit. Patient/Guardian expressed understanding and agreed to proceed.   Follow-up in 1 month for injection Follow-up in 3 months for medication management Shanna Cisco, NP 11/20/2022, 10:33 AM

## 2022-12-06 ENCOUNTER — Ambulatory Visit (INDEPENDENT_AMBULATORY_CARE_PROVIDER_SITE_OTHER): Payer: Commercial Managed Care - HMO | Admitting: *Deleted

## 2022-12-06 ENCOUNTER — Other Ambulatory Visit (HOSPITAL_COMMUNITY): Payer: Self-pay

## 2022-12-06 ENCOUNTER — Ambulatory Visit (HOSPITAL_COMMUNITY): Payer: MEDICAID

## 2022-12-06 ENCOUNTER — Encounter (HOSPITAL_COMMUNITY): Payer: Self-pay

## 2022-12-06 VITALS — BP 110/72 | HR 88 | Resp 16 | Ht 70.0 in | Wt 205.0 lb

## 2022-12-06 DIAGNOSIS — F203 Undifferentiated schizophrenia: Secondary | ICD-10-CM

## 2022-12-06 MED ORDER — PALIPERIDONE PALMITATE ER 156 MG/ML IM SUSY
156.0000 mg | PREFILLED_SYRINGE | Freq: Once | INTRAMUSCULAR | Status: AC
  Administered 2022-12-06: 156 mg via INTRAMUSCULAR

## 2022-12-06 NOTE — Progress Notes (Cosign Needed)
Patient arrived for injection of Invega 156mg . States he was unable to afford his medication but it was suppose to be delivered here. Called his pharmacy, they only had [place] on file. Gave them Medicaid ID, patient states he did not have medicaid but pharmacy did get a paid claim after patient supplied his credit card for the $4 copay. Patient was unaware that he had Medicaid. Sample given and pharmacy will deliver to restock supply used. Denies any issues. Injection given in Left Deltoid.

## 2022-12-22 ENCOUNTER — Other Ambulatory Visit (HOSPITAL_COMMUNITY): Payer: Self-pay

## 2022-12-27 ENCOUNTER — Encounter (HOSPITAL_COMMUNITY): Payer: Self-pay

## 2022-12-27 ENCOUNTER — Ambulatory Visit (INDEPENDENT_AMBULATORY_CARE_PROVIDER_SITE_OTHER): Payer: Commercial Managed Care - HMO | Admitting: *Deleted

## 2022-12-27 VITALS — BP 138/88 | HR 78 | Resp 16 | Ht 70.0 in | Wt 204.8 lb

## 2022-12-27 DIAGNOSIS — F203 Undifferentiated schizophrenia: Secondary | ICD-10-CM

## 2022-12-27 MED ORDER — PALIPERIDONE PALMITATE ER 156 MG/ML IM SUSY
156.0000 mg | PREFILLED_SYRINGE | Freq: Once | INTRAMUSCULAR | Status: AC
Start: 2022-12-27 — End: 2022-12-27
  Administered 2022-12-27: 156 mg via INTRAMUSCULAR

## 2022-12-27 NOTE — Progress Notes (Cosign Needed)
Patient arrived for injection of [place] 156mg  given in Right Deltoid without issues or complaints. Denies side effects from medication. Pleasant, cooperative, does smile during entire visit. When asked what he's smiling about he states "nothing."

## 2023-01-18 ENCOUNTER — Encounter (HOSPITAL_COMMUNITY): Payer: Self-pay

## 2023-01-18 ENCOUNTER — Other Ambulatory Visit: Payer: Self-pay

## 2023-01-18 ENCOUNTER — Other Ambulatory Visit (HOSPITAL_COMMUNITY): Payer: Self-pay

## 2023-01-18 NOTE — Progress Notes (Signed)
Specialty Pharmacy Initial Fill Coordination Note  James [name] is a 32 y.o. male contacted today regarding refills of specialty medication(s) Paliperidone Palmitate .  Patient requested Courier to Provider Office  on 01/22/23  to verified address [facility] [address]   Medication will be filled on 9/26 or 9/27. Appointment is 10/1  Patient is aware of $4 copayment.

## 2023-01-19 ENCOUNTER — Other Ambulatory Visit: Payer: Self-pay

## 2023-01-24 ENCOUNTER — Ambulatory Visit (HOSPITAL_COMMUNITY): Payer: 59

## 2023-01-24 ENCOUNTER — Encounter (HOSPITAL_COMMUNITY): Payer: Self-pay

## 2023-01-24 ENCOUNTER — Ambulatory Visit (HOSPITAL_COMMUNITY): Payer: 59 | Admitting: Family

## 2023-01-24 ENCOUNTER — Ambulatory Visit (INDEPENDENT_AMBULATORY_CARE_PROVIDER_SITE_OTHER): Payer: 59

## 2023-01-24 VITALS — BP 137/70 | HR 75 | Ht 68.0 in | Wt 203.0 lb

## 2023-01-24 DIAGNOSIS — S065XAA Traumatic subdural hemorrhage with loss of consciousness status unknown, initial encounter: Secondary | ICD-10-CM

## 2023-01-24 DIAGNOSIS — Z79899 Other long term (current) drug therapy: Secondary | ICD-10-CM | POA: Diagnosis not present

## 2023-01-24 DIAGNOSIS — F200 Paranoid schizophrenia: Secondary | ICD-10-CM | POA: Diagnosis not present

## 2023-01-24 DIAGNOSIS — F19929 Other psychoactive substance use, unspecified with intoxication, unspecified: Secondary | ICD-10-CM

## 2023-01-24 MED ORDER — PALIPERIDONE PALMITATE ER 156 MG/ML IM SUSY
156.0000 mg | PREFILLED_SYRINGE | INTRAMUSCULAR | Status: AC
  Administered 2023-01-24 – 2023-09-06 (×7): 156 mg via INTRAMUSCULAR

## 2023-01-24 NOTE — Progress Notes (Unsigned)
BH MD/PA/NP OP Progress Note  01/24/2023 1:44 PM James [name]  MRN:  [id number]  Chief Complaint:  James [name] stated "  I am good."   HPI: James [name] is a 32 year old African-American male who presents to the injection clinic for monthly medication.  He is currently prescribed Invega Sustenna 156 mg.  He denied any concerns related to medications.  Does have concerns related to recent bouts of vomiting.  He denied any symptoms related to headache nausea vomiting or dizziness.  States " I been throwing up after I eat."  Discussed following up with primary care provider and/or gastroenterologist for reported symptoms.  Patient was receptive to the plan.  Patient presents malodorous and disheveled.  Denied illicit drug use or substance abuse history.  States he is currently seeking employment  He is denying suicidal or homicidal ideations.  Denies auditory visual hallucinations.  Reports a " fair appetite" states he is resting well throughout the night.  During evaluation ARTIST BLOOM is sitting in exam room; he is alert/oriented x 3; calm/cooperative; and mood congruent with affect.  Patient is speaking in a clear tone at moderate volume, and normal pace; with good eye contact. His thought process is coherent and relevant; There is no indication that he is currently responding to internal/external stimuli or experiencing delusional thought content.  Patient responses are slighly delayed.    Patient denies suicidal/self-harm/homicidal ideation, psychosis, and paranoia.  Patient has remained calm throughout assessment and has answered questions appropriately.    Visit Diagnosis:    ICD-10-CM   1. 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 Long term current use of antipsychotic medication  Z79.899       Past Psychiatric History:   Past Medical History:  Past Medical History:  Diagnosis Date   Insomnia    Subdural hematoma (HCC)     Past Surgical History:  Procedure Laterality Date    HAND SURGERY      Family Psychiatric History:   Family History:  Family History  Problem Relation Age of Onset   Healthy Mother     Social History:  Social History   Socioeconomic History   Marital status: Single    Spouse name: Not on file   Number of children: Not on file   Years of education: Not on file   Highest education level: Not on file  Occupational History   Not on file  Tobacco Use   Smoking status: Former    Current packs/day: 0.50    Types: Cigarettes   Smokeless tobacco: Never  Vaping Use   Vaping status: Never Used  Substance and Sexual Activity   Alcohol use: Yes    Comment: occasionally   Drug use: Not Currently    Types: Marijuana   Sexual activity: Yes    Birth control/protection: Condom    Comment: occasional condom use  Other Topics Concern   Not on file  Social History Narrative   Not on file   Social Determinants of Health   Financial Resource Strain: Not on file  Food Insecurity: No Food Insecurity (09/08/2022)   Hunger Vital Sign    Worried About Running Out of Food in the Last Year: Never true    Ran Out of Food in the Last Year: Never true  Transportation Needs: No Transportation Needs (09/08/2022)   PRAPARE - Administrator, Civil Service (Medical): No    Lack of Transportation (Non-Medical): No  Physical Activity: Not on file  Stress: Not on file  Social Connections: Not on file    Allergies: No Known Allergies  Metabolic Disorder Labs: Lab Results  Component Value Date   HGBA1C 5.4 11/10/2021   MPG 105.41 07/31/2018   Lab Results  Component Value Date   PROLACTIN 18.1 (H) 12/21/2021   Lab Results  Component Value Date   CHOL 254 (H) 12/21/2021   TRIG 91 12/21/2021   HDL 36 (L) 12/21/2021   CHOLHDL 7.1 12/21/2021   VLDL 18 12/21/2021   LDLCALC 200 (H) 12/21/2021   LDLCALC 188 (H) 11/10/2021   Lab Results  Component Value Date   TSH 1.335 12/21/2021   TSH 1.190 11/10/2021    Therapeutic Level  Labs: No results found for: "LITHIUM" No results found for: "VALPROATE" No results found for: "CBMZ"  Current Medications: Current Outpatient Medications  Medication Sig Dispense Refill   paliperidone (INVEGA SUSTENNA) 156 MG/ML SUSY injection Inject 1 mL (156 mg total) into the muscle every 28 (twenty-eight) days. 1 mL 11   No current facility-administered medications for this visit.     Musculoskeletal: Strength & Muscle Tone: within normal limits Gait & Station: normal Patient leans: N/A  Psychiatric Specialty Exam: Review of Systems  There were no vitals taken for this visit.There is no height or weight on file to calculate BMI.  General Appearance: Disheveled  Eye Contact:  Good  Speech:  Clear and Coherent  Volume:  Normal  Mood:  Anxious and Depressed  Affect:  Congruent  Thought Process:  Coherent  Orientation:  Full (Time, Place, and Person)  Thought Content: Logical   Suicidal Thoughts:  No  Homicidal Thoughts:  No  Memory:  Immediate;   Good Recent;   Good  Judgement:  Fair  Insight:  Fair  Psychomotor Activity:  Normal  Concentration:  Concentration: Good  Recall:  Good  Fund of Knowledge: Good  Language: Fair  Akathisia:  No  Handed:  Right  AIMS (if indicated): done  Assets:  Psychologist, counselling Resources/Insurance Resilience Social Support  ADL's:  Intact  Cognition: WNL  Sleep:  Good   Screenings: AIMS    Flowsheet Row Office Visit from 11/08/2022 in [facility] Admission (Dis[facility]  AIMS Total Score 0 0      AUDIT    Flowsheet Row Admission (Discharged) from [facility]  Alcohol Use Disorder Identification Test Final Score (AUDIT) 1      GAD-7    Flowsheet Row Office Visit from 11/20/2022 in [facility] Office Visit from  09/26/2022 in [facility] Primary Care & Sports Medicine at [facility] Office Visit from 03/27/2022 in [facility] Primary Care & Sports Medicine at [facility]  Total GAD-7 Score 0 0 0      PHQ2-9    Flowsheet Row Office Visit from 11/20/2022 in [facility] Office Visit from 11/08/2022 in [facility] Office Visit from 09/26/2022 in [facility] Primary Care & Sports Medicine at [facility] Office Visit from 03/27/2022 in [facility] Primary Care & Sports Medicine at [facility] ED from 12/21/2021 in [facility]  PHQ-2 Total Score 0 1 0 1 0  PHQ-9 Total Score 1 -- 0 1 5      Flowsheet Row Office Visit from 11/20/2022 in [facility] Office Visit  from 11/08/2022 in [facility] ED from 09/08/2022 in [facility]  C-SSRS RISK CATEGORY No Risk No Risk No Risk        Assessment and Plan: Kelsey Edman 32 year old African-American male presents to injection clinic for monthly [place].  Reports his mood has stabilized.  Continues to deny suicidal or homicidal ideations.  Denies auditory or visual hallucinations.  Reports he has been taking and tolerating medications well.  Denied any concerns related to injection or injection site.  Follow-up 28 days for injection 3 month follow-up for Psychiatry Services Annual Labs due: Metabolic panel- prolactin, EKG, lipid panel and A1c    Collaboration of Care: Collaboration of Care: Medication Management AEB Invega sustenna 156 mg  Patient/Guardian was advised Release of Information must be obtained prior to any record release in order to collaborate their care with an outside provider. Patient/Guardian was advised if they have not already done so to contact the registration department to sign all necessary forms in order for [place] to release information regarding  their care.   Consent: Patient/Guardian gives verbal consent for treatment and assignment of benefits for services provided during this visit. Patient/Guardian expressed understanding and agreed to proceed.    Oneta Rack, NP 01/24/2023, 1:44 PM

## 2023-01-24 NOTE — Progress Notes (Cosign Needed)
Patient arrives today for his past due injection of Invega Sustena 156 mg. Patient saw Alcario Drought first. Patient was cooperative with a flat affect and limited eye contact. Patient denies SI/HI or AVH. Patient gives short one word answers to questions. Injection was prepared as ordered and administered in patients left Deltoid. Patient tolerated well and without complaint. Patient will return in 30 days.   ZOX:[id number]  LOT: [id number] EXP: 2025 - OCT

## 2023-01-25 ENCOUNTER — Encounter (HOSPITAL_COMMUNITY): Payer: Self-pay | Admitting: Family

## 2023-01-30 ENCOUNTER — Ambulatory Visit (HOSPITAL_BASED_OUTPATIENT_CLINIC_OR_DEPARTMENT_OTHER): Payer: Managed Care, Other (non HMO) | Admitting: Family Medicine

## 2023-01-31 ENCOUNTER — Ambulatory Visit (HOSPITAL_BASED_OUTPATIENT_CLINIC_OR_DEPARTMENT_OTHER): Payer: Medicaid Other | Admitting: Family Medicine

## 2023-02-01 ENCOUNTER — Encounter (HOSPITAL_BASED_OUTPATIENT_CLINIC_OR_DEPARTMENT_OTHER): Payer: Self-pay | Admitting: Family Medicine

## 2023-02-01 ENCOUNTER — Ambulatory Visit (INDEPENDENT_AMBULATORY_CARE_PROVIDER_SITE_OTHER): Payer: Managed Care, Other (non HMO) | Admitting: Family Medicine

## 2023-02-01 VITALS — BP 131/90 | HR 109 | Ht 68.0 in | Wt 205.0 lb

## 2023-02-01 DIAGNOSIS — R1110 Vomiting, unspecified: Secondary | ICD-10-CM | POA: Insufficient documentation

## 2023-02-01 NOTE — Progress Notes (Signed)
    Procedures performed today:    None.  Independent interpretation of notes and tests performed by another provider:   None.  Brief History, Exam, Impression, and Recommendations:    BP (!) 131/90 (BP Location: Right Arm, Patient Position: Sitting, Cuff Size: Normal)   Pulse (!) 109   Ht 5\' 8"  (1.727 m)   Wt 205 lb (93 kg)   SpO2 97%   BMI 31.17 kg/m   Vomiting, unspecified vomiting type, unspecified whether nausea present Assessment & Plan: Patient reports that for about 1 month or so, he has been experiencing intermittent vomiting.  Has not had significant nausea associated with this.  This will occur about every other day or so.  Typically just 1 episode, not persistent.  Has mild central abdominal discomfort.  Has not had any change in bowel movements.  Denies any issues with dysuria, has not noticed any blood in vomit.  Denies any fever, chills, sweats, no significant weight changes. Only change the history is that he did start injectable medication with psychiatrist, this was about 2 to 3 months ago. On exam, patient is in no acute distress, vital signs stable.  Abdomen with normal bowel sounds, soft, nontender, nondistended. Uncertain cause for observed symptoms.  Reviewed medication change with injectable medication being used by psychiatry at this point.  Does have vomiting as a fairly frequent side effect with treatment.  It is certainly possible that observed symptoms are related to side effect from this medication.  Otherwise, we will proceed with laboratory assessment today in order to exclude other potential causes of observed symptoms.  Advised that we should get these results back by tomorrow.  If labs are normal, would recommend further discussing with psychiatry office regarding symptoms and possible association with injectable medication.  We will also place referral to GI if labs are normal, in order to proceed with further evaluation Recommend maintaining diary  monitoring food and beverage intake and symptoms in order to assess for potential contributing factors for vomiting.  Orders: -     CBC with Differential/Platelet -     Comprehensive metabolic panel -     Lipase  Return if symptoms worsen or fail to improve.   ___________________________________________ James Divelbiss de [place], MD, ABFM, CAQSM Primary Care and Sports Medicine [facility]

## 2023-02-01 NOTE — Assessment & Plan Note (Signed)
Patient reports that for about 1 month or so, he has been experiencing intermittent vomiting.  Has not had significant nausea associated with this.  This will occur about every other day or so.  Typically just 1 episode, not persistent.  Has mild central abdominal discomfort.  Has not had any change in bowel movements.  Denies any issues with dysuria, has not noticed any blood in vomit.  Denies any fever, chills, sweats, no significant weight changes. Only change the history is that he did start injectable medication with psychiatrist, this was about 2 to 3 months ago. On exam, patient is in no acute distress, vital signs stable.  Abdomen with normal bowel sounds, soft, nontender, nondistended. Uncertain cause for observed symptoms.  Reviewed medication change with injectable medication being used by psychiatry at this point.  Does have vomiting as a fairly frequent side effect with treatment.  It is certainly possible that observed symptoms are related to side effect from this medication.  Otherwise, we will proceed with laboratory assessment today in order to exclude other potential causes of observed symptoms.  Advised that we should get these results back by tomorrow.  If labs are normal, would recommend further discussing with psychiatry office regarding symptoms and possible association with injectable medication.  We will also place referral to GI if labs are normal, in order to proceed with further evaluation Recommend maintaining diary monitoring food and beverage intake and symptoms in order to assess for potential contributing factors for vomiting.

## 2023-02-02 LAB — COMPREHENSIVE METABOLIC PANEL
ALT: 8 [IU]/L (ref 0–44)
AST: 15 [IU]/L (ref 0–40)
Albumin: 3.9 g/dL — ABNORMAL LOW (ref 4.1–5.1)
Alkaline Phosphatase: 40 [IU]/L — ABNORMAL LOW (ref 44–121)
BUN/Creatinine Ratio: 10 (ref 9–20)
BUN: 11 mg/dL (ref 6–20)
Bilirubin Total: 0.3 mg/dL (ref 0.0–1.2)
CO2: 19 mmol/L — ABNORMAL LOW (ref 20–29)
Calcium: 8.8 mg/dL (ref 8.7–10.2)
Chloride: 107 mmol/L — ABNORMAL HIGH (ref 96–106)
Creatinine, Ser: 1.1 mg/dL (ref 0.76–1.27)
Globulin, Total: 1.8 g/dL (ref 1.5–4.5)
Glucose: 93 mg/dL (ref 70–99)
Potassium: 3.9 mmol/L (ref 3.5–5.2)
Sodium: 142 mmol/L (ref 134–144)
Total Protein: 5.7 g/dL — ABNORMAL LOW (ref 6.0–8.5)
eGFR: 91 mL/min/{1.73_m2} (ref >59)

## 2023-02-02 LAB — CBC WITH DIFFERENTIAL/PLATELET
Basophils Absolute: 0 10*3/uL (ref 0.0–0.2)
Basos: 0 %
EOS (ABSOLUTE): 0 10*3/uL (ref 0.0–0.4)
Eos: 0 %
Hematocrit: 53 % — ABNORMAL HIGH (ref 37.5–51.0)
Hemoglobin: 17.5 g/dL (ref 13.0–17.7)
Immature Grans (Abs): 0 10*3/uL (ref 0.0–0.1)
Immature Granulocytes: 0 %
Lymphocytes Absolute: 1.3 10*3/uL (ref 0.7–3.1)
Lymphs: 28 %
MCH: 27.4 pg (ref 26.6–33.0)
MCHC: 33 g/dL (ref 31.5–35.7)
MCV: 83 fL (ref 79–97)
Monocytes Absolute: 0.3 10*3/uL (ref 0.1–0.9)
Monocytes: 6 %
Neutrophils Absolute: 3.1 10*3/uL (ref 1.4–7.0)
Neutrophils: 66 %
Platelets: 301 10*3/uL (ref 150–450)
RBC: 6.38 x10E6/uL — ABNORMAL HIGH (ref 4.14–5.80)
RDW: 13.5 % (ref 11.6–15.4)
WBC: 4.7 10*3/uL (ref 3.4–10.8)

## 2023-02-02 LAB — LIPASE: Lipase: 17 U/L (ref 13–78)

## 2023-02-06 ENCOUNTER — Other Ambulatory Visit: Payer: Self-pay

## 2023-02-06 NOTE — Progress Notes (Signed)
Specialty Pharmacy Refill Coordination Note  James [name] is a 32 y.o. male contacted today regarding refills of specialty medication(s) Paliperidone Palmitate   Patient requested Courier to Provider Office   Delivery date: 02/14/23   Verified address: [facility] [address], [place] [address]   Medication will be filled on 02/13/23.

## 2023-02-13 ENCOUNTER — Other Ambulatory Visit: Payer: Self-pay

## 2023-02-16 ENCOUNTER — Other Ambulatory Visit: Payer: Self-pay

## 2023-02-21 ENCOUNTER — Ambulatory Visit (HOSPITAL_COMMUNITY): Payer: 59

## 2023-02-22 ENCOUNTER — Ambulatory Visit (HOSPITAL_COMMUNITY): Payer: 59 | Admitting: Student

## 2023-02-22 ENCOUNTER — Telehealth (HOSPITAL_COMMUNITY): Payer: Self-pay | Admitting: *Deleted

## 2023-02-22 ENCOUNTER — Encounter (HOSPITAL_COMMUNITY): Payer: Self-pay

## 2023-02-22 ENCOUNTER — Ambulatory Visit (INDEPENDENT_AMBULATORY_CARE_PROVIDER_SITE_OTHER): Payer: Commercial Managed Care - HMO | Admitting: *Deleted

## 2023-02-22 VITALS — BP 115/85 | HR 120 | Resp 16 | Ht 68.0 in | Wt 198.6 lb

## 2023-02-22 DIAGNOSIS — K5900 Constipation, unspecified: Secondary | ICD-10-CM | POA: Diagnosis not present

## 2023-02-22 DIAGNOSIS — F200 Paranoid schizophrenia: Secondary | ICD-10-CM

## 2023-02-22 DIAGNOSIS — Z79899 Other long term (current) drug therapy: Secondary | ICD-10-CM | POA: Diagnosis not present

## 2023-02-22 MED ORDER — POLYETHYLENE GLYCOL 3350 17 G PO PACK
17.0000 g | PACK | Freq: Every day | ORAL | 0 refills | Status: AC
Start: 2023-02-22 — End: 2023-03-24

## 2023-02-22 MED ORDER — PALIPERIDONE PALMITATE ER 156 MG/ML IM SUSY
156.0000 mg | PREFILLED_SYRINGE | Freq: Once | INTRAMUSCULAR | Status: AC
Start: 2023-02-22 — End: 2023-02-22
  Administered 2023-02-22: 156 mg via INTRAMUSCULAR
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
## 2023-02-22 NOTE — Progress Notes (Signed)
 Patient arrived for his injection of [place] 156mg . Given in his right deltoid without issue or complaint. Did ask that a note be written by his provider here about being compliant with his medication so that he is able to get a job. He will pick it up at a later date. No issues with medication. Pleasant, cooperative.

## 2023-02-22 NOTE — Telephone Encounter (Signed)
Patient is asking for a note so that he can give to a potential employer. He needs it to say that he is compliant with taking his medication and is doing well and his diagnosis will not interfere. He would like to get a job.

## 2023-02-23 ENCOUNTER — Encounter (HOSPITAL_COMMUNITY): Payer: Self-pay | Admitting: Psychiatry

## 2023-02-23 NOTE — Telephone Encounter (Signed)
My best guess would be the person who saw him yesterday would be able to right about whether or not he has been compliant and is doing well.

## 2023-02-23 NOTE — Telephone Encounter (Signed)
Provider spoke to patient today.  He informed Clinical research associate that he feels mentally stable and is seeking assistance from DSS.  He notes that he would like to maintain food stamps.  He reports that he needs a letter documenting  that he has showed up in the clinic to receive his injection.  Provider was agreeable to write this letter as patient has briefly shown up to each injection since 09/29/2022 through current.  Patient made aware that he can pick it up at his convenience.  No other concerns noted at this time.

## 2023-03-05 ENCOUNTER — Other Ambulatory Visit (HOSPITAL_COMMUNITY): Payer: Self-pay

## 2023-03-14 ENCOUNTER — Other Ambulatory Visit: Payer: Self-pay

## 2023-03-14 NOTE — Progress Notes (Signed)
Specialty Pharmacy Refill Coordination Note  James [name] is a 33 y.o. male contacted today regarding refills of specialty medication(s) Paliperidone Palmitate   Patient requested Courier to Provider Office   Delivery date: 03/19/23   Verified address: [facility] [address], [place] [address]   Medication will be filled on 03/16/23.

## 2023-03-15 ENCOUNTER — Other Ambulatory Visit (HOSPITAL_COMMUNITY): Payer: Self-pay

## 2023-03-21 ENCOUNTER — Ambulatory Visit (HOSPITAL_COMMUNITY): Payer: Commercial Managed Care - HMO

## 2023-03-28 ENCOUNTER — Ambulatory Visit (HOSPITAL_COMMUNITY): Payer: MEDICAID

## 2023-03-28 ENCOUNTER — Encounter (HOSPITAL_COMMUNITY): Payer: Self-pay

## 2023-03-28 VITALS — BP 119/80 | HR 88 | Wt 208.6 lb

## 2023-03-28 DIAGNOSIS — F411 Generalized anxiety disorder: Secondary | ICD-10-CM

## 2023-03-28 DIAGNOSIS — F200 Paranoid schizophrenia: Secondary | ICD-10-CM

## 2023-03-28 DIAGNOSIS — G4700 Insomnia, unspecified: Secondary | ICD-10-CM

## 2023-03-28 NOTE — Progress Notes (Cosign Needed)
  PATIENT PRESENTS TO THE OFFICE FOR INVEGA SUSTENNA  INJECTION  156, WAS GIVEN BY Jamarious Febo IN THE LEFT DELTOID WITH NO COMPLAINTS PT TOLERATED WELL. AND WILL RETURN 28 DAYS

## 2023-04-10 ENCOUNTER — Other Ambulatory Visit: Payer: Self-pay

## 2023-04-24 ENCOUNTER — Other Ambulatory Visit: Payer: Self-pay

## 2023-04-24 NOTE — Progress Notes (Signed)
 Specialty Pharmacy Refill Coordination Note  James [name] is a 31 y.o. male contacted today regarding refills of specialty medication(s) Paliperidone  Palmitate (INVEGA  SUSTENNA)   Patient requested Courier to Provider Office   Delivery date: 04/30/23   Verified address: [facility] [address] [place] [address]   Medication will be filled on 04/27/23.

## 2023-04-27 ENCOUNTER — Other Ambulatory Visit (HOSPITAL_COMMUNITY): Payer: Self-pay

## 2023-04-27 ENCOUNTER — Other Ambulatory Visit: Payer: Self-pay

## 2023-04-30 ENCOUNTER — Other Ambulatory Visit: Payer: Self-pay

## 2023-05-01 ENCOUNTER — Other Ambulatory Visit: Payer: Self-pay

## 2023-05-01 NOTE — Progress Notes (Signed)
 Pharmacy Patient Advocate Encounter   Received notification from Patient Pharmacy that prior authorization for Invega  Sustenna injection is required/requested.   Insurance verification completed.   The patient is insured through [employer] [place] .   Per test claim: PA required; PA submitted to above mentioned insurance via CoverMyMeds Key/confirmation #/EOC [id number] Status is pending

## 2023-05-01 NOTE — Progress Notes (Signed)
 Pharmacy Patient Advocate Encounter  Received notification from [facility] that Prior Authorization for Gean Birchwood Injection has been APPROVED from 05/01/23 to 04/30/24   PA #/Case ID/Reference #: [id number]

## 2023-05-02 ENCOUNTER — Ambulatory Visit (HOSPITAL_COMMUNITY): Payer: Commercial Managed Care - HMO | Admitting: Family

## 2023-05-02 ENCOUNTER — Encounter (HOSPITAL_COMMUNITY): Payer: Self-pay | Admitting: Family

## 2023-05-02 ENCOUNTER — Ambulatory Visit (INDEPENDENT_AMBULATORY_CARE_PROVIDER_SITE_OTHER): Payer: Commercial Managed Care - HMO | Admitting: Family

## 2023-05-02 VITALS — BP 140/78 | HR 88 | Resp 16 | Ht 68.0 in | Wt 206.2 lb

## 2023-05-02 DIAGNOSIS — Z79899 Other long term (current) drug therapy: Secondary | ICD-10-CM

## 2023-05-02 DIAGNOSIS — F200 Paranoid schizophrenia: Secondary | ICD-10-CM

## 2023-05-02 DIAGNOSIS — F251 Schizoaffective disorder, depressive type: Secondary | ICD-10-CM

## 2023-05-02 NOTE — Progress Notes (Cosign Needed)
 Patient arrived for injection of Invega  Sustenna 156mg . He saw the provider first for his routine visit for medication management. Patient denies SI/HI or AV hallucinations. No side effects but did mention that he still has vomiting/nausea at times. Provider aware. Injections prepared and given in Right Deltoid without issue or complaint. Will return in 28 days for next injections.

## 2023-05-02 NOTE — Progress Notes (Addendum)
 BH MD/PA/NP OP Progress Note  05/02/2023 2:04 PM James [name]  MRN:  [id number]  Chief Complaint: Long-acting injection clinic  HPI: James [name] 33 year old African-American male presents today for [place] urgent Care for monthly paliperidone  injection 156 mg. Patient was seen and evaluated face to face. James [name] denied any concerns related to monthly medication, patient continues to endorse bouts of vomiting patient encouraged to follow-up with GI and/or primary care patient smiled and nodded.  Appeared to be receptive to plan.   James [name] has a charted history with schizophrenia paranoia, substance-induced psychotic disorder and long-term use of antipsychotic medications.  Collected CMP, CBC, prolactin,TSH,  A1c metabolic panel at this visit. -Pending results. -Follow-up monthly  James [name] is oriented, alert x 3, cooperative and pleasant.  Slightly fidgety throughout this exam stating he is ready to leave.  Speaking in clear tone moderate volume normal pace.  Good eye contact thought process appears to be clear and relevant.  Does not appear to be responding to internal stimuli.Continues to present malodorous and disheveled, no concerns related to substance abuse.  He is denying suicidal or homicidal ideations.  Denies auditory visual hallucinations.  He reports a fair appetite.  States he is resting well throughout the night.  Visit Diagnosis:    ICD-10-CM   1. 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 Long term current use of antipsychotic medication  Z79.899       Past Psychiatric History:   Past Medical History:  Past Medical History:  Diagnosis Date   Insomnia    Subdural hematoma (HCC)     Past Surgical History:  Procedure Laterality Date   HAND SURGERY      Family Psychiatric History:   Family History:  Family History  Problem Relation Age of Onset   Healthy Mother      Social History:  Social History   Socioeconomic History   Marital status: Single    Spouse name: Not on file   Number of children: Not on file   Years of education: Not on file   Highest education level: Not on file  Occupational History   Not on file  Tobacco Use   Smoking status: Former    Current packs/day: 0.50    Types: Cigarettes   Smokeless tobacco: Never  Vaping Use   Vaping status: Never Used  Substance and Sexual Activity   Alcohol use: Yes    Comment: occasionally   Drug use: Not Currently    Types: Marijuana   Sexual activity: Yes    Birth control/protection: Condom    Comment: occasional condom use  Other Topics Concern   Not on file  Social History Narrative   Not on file   Social Drivers of Health   Financial Resource Strain: Not on file  Food Insecurity: No Food Insecurity (09/08/2022)   Hunger Vital Sign    Worried About Running Out of Food in the Last Year: Never true    Ran Out of Food in the Last Year: Never true  Transportation Needs: No Transportation Needs (09/08/2022)   PRAPARE - Administrator, Civil Service (Medical): No    Lack of Transportation (Non-Medical): No  Physical Activity: Not on file  Stress: Not on file  Social Connections: Not on file    Allergies: No Known Allergies  Metabolic Disorder Labs: Lab Results  Component Value Date  HGBA1C 5.4 11/10/2021   MPG 105.41 07/31/2018   Lab Results  Component Value Date   PROLACTIN 18.1 (H) 12/21/2021   Lab Results  Component Value Date   CHOL 254 (H) 12/21/2021   TRIG 91 12/21/2021   HDL 36 (L) 12/21/2021   CHOLHDL 7.1 12/21/2021   VLDL 18 12/21/2021   LDLCALC 200 (H) 12/21/2021   LDLCALC 188 (H) 11/10/2021   Lab Results  Component Value Date   TSH 1.335 12/21/2021   TSH 1.190 11/10/2021    Therapeutic Level Labs: No results found for: LITHIUM No results found for: VALPROATE No results found for: CBMZ  Current Medications: Current  Outpatient Medications  Medication Sig Dispense Refill   paliperidone  (INVEGA  SUSTENNA) 156 MG/ML SUSY injection Inject 1 mL (156 mg total) into the muscle every 28 (twenty-eight) days. 1 mL 11   Current Facility-Administered Medications  Medication Dose Route Frequency Provider Last Rate Last Admin   paliperidone  (INVEGA  SUSTENNA) injection 156 mg  156 mg Intramuscular Q30 days Harl Regan E, NP   156 mg at 05/02/23 1350     Musculoskeletal: Strength & Muscle Tone: within normal limits Gait & Station: normal Patient leans: N/A  Psychiatric Specialty Exam: Review of Systems  Psychiatric/Behavioral:  Negative for decreased concentration, hallucinations, self-injury and sleep disturbance. The patient is nervous/anxious.   All other systems reviewed and are negative.   There were no vitals taken for this visit.There is no height or weight on file to calculate BMI.  General Appearance: Disheveled and malodorous  Eye Contact:  Good  Speech:  Clear and Coherent  Volume:  Normal  Mood:  Anxious and Depressed  Affect:  Congruent  Thought Process:  Coherent  Orientation:  Full (Time, Place, and Person)  Thought Content: Logical   Suicidal Thoughts:  No  Homicidal Thoughts:  No  Memory:  Immediate;   Fair  Judgement:  Fair  Insight:  Fair  Psychomotor Activity:  Normal  Concentration:  Concentration: Good  Recall:  Good  Fund of Knowledge: Good  Language: Good  Akathisia:  No  Handed:  Right  AIMS (if indicated): not done  Assets:  Communication Skills Desire for Improvement  ADL's:  Intact  Cognition: WNL  Sleep:  Good   Screenings: AIMS    Flowsheet Row Office Visit from 11/08/20[facility] in [facility]Dis[facility]  AIMS Total Score 0 0      AUDIT    Flowsheet Row Admission (Discharged) from [facility]  Alcohol Use Disorder Identification Test Final Score (AUDIT) 1      GAD-7    Flowsheet Row Office Visit from 11/20/2022 in [facility] Office Visit from 09/26/2022 in [facility] Primary Care & Sports Medicine at [facility] Office Visit from 03/27/2022 in [facility] Primary Care & Sports Medicine at [facility]  Total GAD-7 Score 0 0 0      PHQ2-9    Flowsheet Row Office Visit from 02/01/2023 in [facility] Primary Care & Sports Medicine at [facility] Office Visit from 11/20/2022 in [facility] Office Visit from 11/08/2022 in [facility] Office Visit from 09/26/2022 in [facility] Primary Care & Sports Medicine at [facility] Office Visit from 03/27/2022 in [facility] Primary Care & Sports Medicine at [facility] Total Score 0 0  1 0 1  PHQ-9 Total Score 0 1 -- 0 1      Flowsheet Row Office Visit from 11/20/2022 in [facility] Office Visit from 11/08/2022 in [facility] ED from 09/08/2022 in [facility]  C-SSRS RISK CATEGORY No Risk No Risk No Risk        Assessment and Plan:  Follow-up 28 days for monthly injection. -Annual labs completed pending results -Encouraged to follow-up with gastroenteritis and/or primary care provider  Collaboration of Care: Collaboration of Care: Other Invega  Sustenna 156 mg  Patient/Guardian was advised Release of Information must be obtained prior to any record release in order to collaborate their care with an outside provider. Patient/Guardian was advised if they have not already done so to contact the registration department to sign all necessary forms in order for us  to release information regarding their care.   Consent: Patient/Guardian gives verbal consent for treatment and assignment of benefits for services provided during  this visit. Patient/Guardian expressed understanding and agreed to proceed.    Staci LOISE Kerns, NP 05/02/2023, 2:04 PM

## 2023-05-04 LAB — CBC WITH DIFFERENTIAL/PLATELET
Basophils Absolute: 0 10*3/uL (ref 0.0–0.2)
Basos: 0 %
EOS (ABSOLUTE): 0.1 10*3/uL (ref 0.0–0.4)
Eos: 2 %
Hematocrit: 49.6 % (ref 37.5–51.0)
Hemoglobin: 16.2 g/dL (ref 13.0–17.7)
Immature Grans (Abs): 0 10*3/uL (ref 0.0–0.1)
Immature Granulocytes: 0 %
Lymphocytes Absolute: 1.2 10*3/uL (ref 0.7–3.1)
Lymphs: 25 %
MCH: 27.6 pg (ref 26.6–33.0)
MCHC: 32.7 g/dL (ref 31.5–35.7)
MCV: 85 fL (ref 79–97)
Monocytes Absolute: 0.4 10*3/uL (ref 0.1–0.9)
Monocytes: 8 %
Neutrophils Absolute: 3.2 10*3/uL (ref 1.4–7.0)
Neutrophils: 65 %
Platelets: 345 10*3/uL (ref 150–450)
RBC: 5.87 x10E6/uL — ABNORMAL HIGH (ref 4.14–5.80)
RDW: 13.2 % (ref 11.6–15.4)
WBC: 4.9 10*3/uL (ref 3.4–10.8)

## 2023-05-04 LAB — COMPREHENSIVE METABOLIC PANEL
ALT: 12 [IU]/L (ref 0–44)
AST: 16 [IU]/L (ref 0–40)
Albumin: 4 g/dL — ABNORMAL LOW (ref 4.1–5.1)
Alkaline Phosphatase: 52 [IU]/L (ref 44–121)
BUN/Creatinine Ratio: 10 (ref 9–20)
BUN: 11 mg/dL (ref 6–20)
Bilirubin Total: 0.3 mg/dL (ref 0.0–1.2)
CO2: 19 mmol/L — ABNORMAL LOW (ref 20–29)
Calcium: 9.3 mg/dL (ref 8.7–10.2)
Chloride: 106 mmol/L (ref 96–106)
Creatinine, Ser: 1.13 mg/dL (ref 0.76–1.27)
Globulin, Total: 2.1 g/dL (ref 1.5–4.5)
Glucose: 82 mg/dL (ref 70–99)
Potassium: 4.3 mmol/L (ref 3.5–5.2)
Sodium: 141 mmol/L (ref 134–144)
Total Protein: 6.1 g/dL (ref 6.0–8.5)
eGFR: 88 mL/min/{1.73_m2} (ref >59)

## 2023-05-04 LAB — THYROID PANEL WITH TSH
Free Thyroxine Index: 2.8 (ref 1.2–4.9)
T3 Uptake Ratio: 30 % (ref 24–39)
T4, Total: 9.2 ug/dL (ref 4.5–12.0)
TSH: 1.16 u[IU]/mL (ref 0.450–4.500)

## 2023-05-04 LAB — HEMOGLOBIN A1C
Est. average glucose Bld gHb Est-mCnc: 105 mg/dL
Hgb A1c MFr Bld: 5.3 % (ref 4.8–5.6)

## 2023-05-22 ENCOUNTER — Other Ambulatory Visit: Payer: Self-pay

## 2023-05-22 NOTE — Progress Notes (Signed)
Specialty Pharmacy Refill Coordination Note  James [name] is a 33 y.o. male contacted today regarding refills of specialty medication(s) Paliperidone Palmitate (INVEGA SUSTENNA)   Patient requested Courier to Provider Office   Delivery date: 05/28/23   Verified address: [facility] [address] [place] [address]   Medication will be filled on 05/25/23.

## 2023-05-25 ENCOUNTER — Other Ambulatory Visit: Payer: Self-pay

## 2023-05-30 ENCOUNTER — Ambulatory Visit (HOSPITAL_COMMUNITY): Payer: Commercial Managed Care - HMO

## 2023-05-31 ENCOUNTER — Encounter (HOSPITAL_COMMUNITY): Payer: Self-pay

## 2023-05-31 ENCOUNTER — Ambulatory Visit (INDEPENDENT_AMBULATORY_CARE_PROVIDER_SITE_OTHER): Payer: Commercial Managed Care - HMO

## 2023-05-31 VITALS — BP 138/80 | HR 80 | Ht 68.0 in | Wt 204.2 lb

## 2023-05-31 DIAGNOSIS — F2081 Schizophreniform disorder: Secondary | ICD-10-CM | POA: Diagnosis not present

## 2023-05-31 NOTE — Progress Notes (Cosign Needed)
 Pt presents today for injection of Invega  sustenna given in left deltoid with no complaints. Pt states that he has been in good conditions since last visit, with no pains bothering him. Pt stated that he did feel sick old pt to either schedule an appointment with his PCP or seek help from urgent care/ ED. PT states that he wanted to talk to social worker for work and monetary problems, which he was referred.

## 2023-06-19 ENCOUNTER — Other Ambulatory Visit: Payer: Self-pay

## 2023-06-19 NOTE — Progress Notes (Signed)
 Specialty Pharmacy Refill Coordination Note  James [name] is a 33 y.o. male contacted today regarding refills of specialty medication(s) Paliperidone Palmitate (INVEGA SUSTENNA)   Patient requested Courier to Provider Office   Delivery date: 06/25/23   Verified address: [facility] [address] [place] [address]   Medication will be filled on 06/22/23.

## 2023-06-22 ENCOUNTER — Other Ambulatory Visit: Payer: Self-pay

## 2023-06-28 ENCOUNTER — Ambulatory Visit (INDEPENDENT_AMBULATORY_CARE_PROVIDER_SITE_OTHER): Payer: Commercial Managed Care - HMO

## 2023-06-28 ENCOUNTER — Encounter (HOSPITAL_COMMUNITY): Payer: Self-pay

## 2023-06-28 ENCOUNTER — Telehealth (HOSPITAL_BASED_OUTPATIENT_CLINIC_OR_DEPARTMENT_OTHER): Payer: Self-pay | Admitting: *Deleted

## 2023-06-28 VITALS — BP 149/90 | HR 101 | Wt 206.6 lb

## 2023-06-28 DIAGNOSIS — F411 Generalized anxiety disorder: Secondary | ICD-10-CM | POA: Diagnosis not present

## 2023-06-28 DIAGNOSIS — F200 Paranoid schizophrenia: Secondary | ICD-10-CM

## 2023-06-28 DIAGNOSIS — G4700 Insomnia, unspecified: Secondary | ICD-10-CM

## 2023-06-28 NOTE — Progress Notes (Cosign Needed)
  PATIENT PRESENTS TO THE OFFICE FOR INVEGA SUSTENNA  INJECTION  156, WAS GIVEN BY Jamarious Febo IN THE LEFT DELTOID WITH NO COMPLAINTS PT TOLERATED WELL. AND WILL RETURN 28 DAYS

## 2023-06-28 NOTE — Telephone Encounter (Signed)
 Please advise if you would like to refer patient to gastro

## 2023-06-28 NOTE — Telephone Encounter (Signed)
 Copied from CRM [id number]. Topic: Referral - Request for Referral >> Jun 28, 2023  2:02 PM Antwanette L wrote: Did the patient discuss referral with their provider in the last year? Yes   Appointment offered? Yes   Type of order/referral and detailed reason for visit: Gastrointestinal   Preference of office, provider, location: n/a  If referral order, have you been seen by this specialty before? No   Can we respond through MyChart? No. Contact patient by phone at  [phone]

## 2023-06-28 NOTE — Patient Instructions (Signed)
 Please follow-up with the 2 practices listed below to obtain primary care services:  [facility] [address] [place] [phone]  [facility] [address] [place] [phone]

## 2023-06-29 ENCOUNTER — Other Ambulatory Visit (HOSPITAL_BASED_OUTPATIENT_CLINIC_OR_DEPARTMENT_OTHER): Payer: Self-pay | Admitting: *Deleted

## 2023-06-29 ENCOUNTER — Encounter: Payer: Self-pay | Admitting: Physician Assistant

## 2023-06-29 DIAGNOSIS — R1110 Vomiting, unspecified: Secondary | ICD-10-CM

## 2023-06-29 NOTE — Progress Notes (Signed)
 Marland Kitchen

## 2023-06-29 NOTE — Telephone Encounter (Signed)
 Patient advised referral was placed.

## 2023-07-17 ENCOUNTER — Other Ambulatory Visit (HOSPITAL_COMMUNITY): Payer: Self-pay

## 2023-07-17 ENCOUNTER — Other Ambulatory Visit: Payer: Self-pay

## 2023-07-17 NOTE — Progress Notes (Signed)
 Specialty Pharmacy Refill Coordination Note  James [name] is a 33 y.o. male contacted today regarding refills of specialty medication(s) Paliperidone Palmitate (INVEGA SUSTENNA)   Patient requested Courier to Provider Office   Delivery date: 07/24/23   Verified address: (Patient-Rptd) [address] [place], [place] [address]   Medication will be filled on 07/23/23.

## 2023-07-26 ENCOUNTER — Ambulatory Visit (HOSPITAL_COMMUNITY)

## 2023-08-01 ENCOUNTER — Encounter (HOSPITAL_COMMUNITY): Payer: Self-pay

## 2023-08-01 ENCOUNTER — Ambulatory Visit (INDEPENDENT_AMBULATORY_CARE_PROVIDER_SITE_OTHER): Admitting: *Deleted

## 2023-08-01 ENCOUNTER — Ambulatory Visit (INDEPENDENT_AMBULATORY_CARE_PROVIDER_SITE_OTHER): Admitting: Family

## 2023-08-01 VITALS — BP 127/82 | HR 87 | Resp 16 | Ht 68.0 in | Wt 200.4 lb

## 2023-08-01 DIAGNOSIS — F251 Schizoaffective disorder, depressive type: Secondary | ICD-10-CM

## 2023-08-01 DIAGNOSIS — F411 Generalized anxiety disorder: Secondary | ICD-10-CM

## 2023-08-01 DIAGNOSIS — F200 Paranoid schizophrenia: Secondary | ICD-10-CM | POA: Diagnosis not present

## 2023-08-01 NOTE — Progress Notes (Cosign Needed)
 Patient arrived for his injection of Invega 156mg  that was delivered by his pharmacy. Denies any side effects of medication. Denies SI/HI or AV hallucinations.Pleasant, cooperative smiles when spoken to with appropriate conversation. Injection prepared as ordered and given in Right Deltoid without issue or complaint.

## 2023-08-01 NOTE — Progress Notes (Deleted)
 BH MD/PA/NP OP Progress Note  08/01/2023 2:19 PM James [name]  MRN:  [id number]  Chief Complaint: Injection clinic  HPI:  James [name]    Visit Diagnosis: No diagnosis found.  Past Psychiatric History: ***  Past Medical History:  Past Medical History:  Diagnosis Date   Insomnia    Subdural hematoma (HCC)     Past Surgical History:  Procedure Laterality Date   HAND SURGERY      Family Psychiatric History: ***  Family History:  Family History  Problem Relation Age of Onset   Healthy Mother     Social History:  Social History   Socioeconomic History   Marital status: Single    Spouse name: Not on file   Number of children: Not on file   Years of education: Not on file   Highest education level: Not on file  Occupational History   Not on file  Tobacco Use   Smoking status: Former    Current packs/day: 0.50    Types: Cigarettes   Smokeless tobacco: Never  Vaping Use   Vaping status: Never Used  Substance and Sexual Activity   Alcohol use: Yes    Comment: occasionally   Drug use: Not Currently    Types: Marijuana   Sexual activity: Yes    Birth control/protection: Condom    Comment: occasional condom use  Other Topics Concern   Not on file  Social History Narrative   Not on file   Social Drivers of Health   Financial Resource Strain: Not on file  Food Insecurity: No Food Insecurity (09/08/2022)   Hunger Vital Sign    Worried About Running Out of Food in the Last Year: Never true    Ran Out of Food in the Last Year: Never true  Transportation Needs: No Transportation Needs (09/08/2022)   PRAPARE - Administrator, Civil Service (Medical): No    Lack of Transportation (Non-Medical): No  Physical Activity: Not on file  Stress: Not on file  Social Connections: Not on file    Allergies: No Known Allergies  Metabolic Disorder Labs: Lab Results  Component Value Date   HGBA1C 5.3 05/02/2023   MPG 105.41 07/31/2018   Lab Results   Component Value Date   PROLACTIN 18.1 (H) 12/21/2021   Lab Results  Component Value Date   CHOL 254 (H) 12/21/2021   TRIG 91 12/21/2021   HDL 36 (L) 12/21/2021   CHOLHDL 7.1 12/21/2021   VLDL 18 12/21/2021   LDLCALC 200 (H) 12/21/2021   LDLCALC 188 (H) 11/10/2021   Lab Results  Component Value Date   TSH 1.160 05/02/2023   TSH 1.335 12/21/2021    Therapeutic Level Labs: No results found for: "LITHIUM" No results found for: "VALPROATE" No results found for: "CBMZ"  Current Medications: Current Outpatient Medications  Medication Sig Dispense Refill   paliperidone (INVEGA SUSTENNA) 156 MG/ML SUSY injection Inject 1 mL (156 mg total) into the muscle every 28 (twenty-eight) days. 1 mL 11   Current Facility-Administered Medications  Medication Dose Route Frequency Provider Last Rate Last Admin   paliperidone (INVEGA SUSTENNA) injection 156 mg  156 mg Intramuscular Q30 days Toy Cookey E, NP   156 mg at 06/28/23 1412     Musculoskeletal: Strength & Muscle Tone: within normal limits Gait & Station: normal Patient leans: N/A  Psychiatric Specialty Exam: Review of Systems  There were no vitals taken for this visit.There is no height or weight on file to calculate BMI.  General Appearance: Disheveled, malodorous  Eye Contact:  Good  Speech:  Clear and Coherent  Volume:  Decreased  Mood:  Anxious and Depressed  Affect:  Congruent  Thought Process:  Coherent  Orientation:  Full (Time, Place, and Person)  Thought Content: Logical   Suicidal Thoughts:  No  Homicidal Thoughts:  No  Memory:  Immediate;   Good Recent;   Fair  Judgement:  Fair  Insight:  Fair  Psychomotor Activity:  Normal  Concentration:  Concentration: Good  Recall:  Good  Fund of Knowledge: Good  Language: Good  Akathisia:  No  Handed:  Right  AIMS (if indicated): done  Assets:  Communication Skills Desire for Improvement Social Support  ADL's:  Intact  Cognition: WNL  Sleep:  Good    Screenings: AIMS    Flowsheet Row Office Visit from 11/08/2022 in [facility] Admission (Discharged) from [facility]  AIMS Total Score 0 0      AUDIT    Flowsheet Row Admission (Discharged) from [facility]  Alcohol Use Disorder Identification Test Final Score (AUDIT) 1      GAD-7    Flowsheet Row Office Visit from 11/20/2022 in [facility] Office Visit from 09/26/2022 in [facility] Primary Care & Sports Medicine at [facility] Office Visit from 03/27/2022 in [facility] Primary Care & [facility] Medicine at [facility]  Total GAD-7 Score 0 0 0      PHQ2-9    Flowsheet Row Office Visit from 02/01/2023 in [facility] Primary Care & Sports Medicine at [facility] Office Visit from 11/20/2022 in [facility] Office Visit from 11/08/2022 in [facility] Office Visit from 09/26/2022 in [facility] Primary Care & Sports Medicine at [facility] Office Visit from 03/27/2022 in [facility] Primary Care & Sports Medicine at [facility]  PHQ-2 Total Score 0 0 1 0 1  PHQ-9 Total Score 0 1 -- 0 1      Flowsheet Row Office Visit from 11/20/2022 in [facility] Office Visit from 11/08/2022 in [facility] ED from 09/08/2022 in [facility]  C-SSRS RISK CATEGORY No Risk No Risk No Risk        Assessment and Plan:    Collaboration of Care: Collaboration of Care: [facility] OP Collaboration of [id number]}  Patient/Guardian was advised Release of Information must be obtained prior to any record release in order to collaborate their care with an outside provider. Patient/Guardian was advised if they have not already done so to contact the registration department  to sign all necessary forms in order for [place] to release information regarding their care.   Consent: Patient/Guardian gives verbal consent for treatment and assignment of benefits for services provided during this visit. Patient/Guardian expressed understanding and agreed to proceed.    Oneta Rack, NP 08/01/2023, 2:19 PM

## 2023-08-01 NOTE — Progress Notes (Signed)
 BH MD/PA/NP OP Progress Note  08/01/2023 2:45 PM James [name]  MRN:  [id number]  Chief Complaint: Injection clinic  HPI:  James [name] is a 33 year old African-American male presents to long-acting injectable clinic for Invega Sustenna 156 mg.  A carries a diagnosis related to substance-induced psychotic disorder, schizophreniform and major depressive disorder.  He denied any concerns at this visit.  He reports a good appetite.  States he is resting well throughout the night.  He denies auditory visual hallucinations.  Denied any thoughts related to suicidal or homicidal ideations.  James [name] reports he is looking forward to starting his new job.  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  He denied illicit drug use currently.  AIMS=0  James [name] is sitting malodorous.  He presents slightly guarded but pleasant.  Very minimal throughout this assessment. He  is alert/oriented x 3; calm/cooperative; and mood congruent with affect.  Patient is speaking in a clear tone at decreased volume, and normal pace; with good eye contact.    His thought process is coherent and relevant; There is no indication that he is currently responding to internal/external stimuli or experiencing delusional thought content.  Patient denies suicidal/self-harm/homicidal ideation, psychosis, and paranoia.  Patient has remained calm throughout assessment and has answered questions appropriately  Visit Diagnosis:    ICD-10-CM   1. 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 Generalized anxiety disorder  F41.1       Past Psychiatric History:   Past Medical History:  Past Medical History:  Diagnosis Date   Insomnia    Subdural hematoma (HCC)     Past Surgical History:  Procedure Laterality Date   HAND SURGERY      Family Psychiatric History:   Family History:  Family History  Problem Relation Age of Onset   Healthy Mother      Social History:  Social History   Socioeconomic History   Marital status: Single    Spouse name: Not on file   Number of children: Not on file   Years of education: Not on file   Highest education level: Not on file  Occupational History   Not on file  Tobacco Use   Smoking status: Former    Current packs/day: 0.50    Types: Cigarettes   Smokeless tobacco: Never  Vaping Use   Vaping status: Never Used  Substance and Sexual Activity   Alcohol use: Yes    Comment: occasionally   Drug use: Not Currently    Types: Marijuana   Sexual activity: Yes    Birth control/protection: Condom    Comment: occasional condom use  Other Topics Concern   Not on file  Social History Narrative   Not on file   Social Drivers of Health   Financial Resource Strain: Not on file  Food Insecurity: No Food Insecurity (09/08/2022)   Hunger Vital Sign    Worried About Running Out of Food in the Last Year: Never true    Ran Out of Food in the Last Year: Never true  Transportation Needs: No Transportation Needs (09/08/2022)   PRAPARE - Administrator, Civil Service (Medical): No    Lack of Transportation (Non-Medical): No  Physical Activity: Not on file  Stress: Not on file  Social Connections: Not on file    Allergies: No Known Allergies  Metabolic Disorder Labs: Lab Results  Component Value Date  HGBA1C 5.3 05/02/2023   MPG 105.41 07/31/2018   Lab Results  Component Value Date   PROLACTIN 18.1 (H) 12/21/2021   Lab Results  Component Value Date   CHOL 254 (H) 12/21/2021   TRIG 91 12/21/2021   HDL 36 (L) 12/21/2021   CHOLHDL 7.1 12/21/2021   VLDL 18 12/21/2021   LDLCALC 200 (H) 12/21/2021   LDLCALC 188 (H) 11/10/2021   Lab Results  Component Value Date   TSH 1.160 05/02/2023   TSH 1.335 12/21/2021    Therapeutic Level Labs: No results found for: "LITHIUM" No results found for: "VALPROATE" No results found for: "CBMZ"  Current Medications: Current  Outpatient Medications  Medication Sig Dispense Refill   paliperidone (INVEGA SUSTENNA) 156 MG/ML SUSY injection Inject 1 mL (156 mg total) into the muscle every 28 (twenty-eight) days. 1 mL 11   Current Facility-Administered Medications  Medication Dose Route Frequency Provider Last Rate Last Admin   paliperidone (INVEGA SUSTENNA) injection 156 mg  156 mg Intramuscular Q30 days Toy Cookey E, NP   156 mg at 08/01/23 1423     Musculoskeletal: Strength & Muscle Tone: within normal limits Gait & Station: normal Patient leans: N/A  Psychiatric Specialty Exam: Review of Systems  There were no vitals taken for this visit.There is no height or weight on file to calculate BMI.  General Appearance: Disheveled, malodorous  Eye Contact:  Good  Speech:  Clear and Coherent  Volume:  Decreased  Mood:  Anxious and Depressed  Affect:  Congruent  Thought Process:  Coherent  Orientation:  Full (Time, Place, and Person)  Thought Content: Logical   Suicidal Thoughts:  No  Homicidal Thoughts:  No  Memory:  Immediate;   Good Recent;   Fair  Judgement:  Fair  Insight:  Fair  Psychomotor Activity:  Normal  Concentration:  Concentration: Good  Recall:  Good  Fund of Knowledge: Good  Language: Good  Akathisia:  No  Handed:  Right  AIMS (if indicated): done  Assets:  Communication Skills Desire for Improvement Social Support  ADL's:  Intact  Cognition: WNL  Sleep:  Good   Screenings: AIMS    Flowsheet Row Office Visit from 11/08/2022 in [facility] Admission (Discharged[facility]  AIMS Total Score 0 0      AUDIT    Flowsheet Row Admission (Discharged) from [facility]  Alcohol Use Disorder Identification Test Final Score (AUDIT) 1      GAD-7    Flowsheet Row Office Visit from 11/20/2022 in [facility] Office Visit from 09/26/2022 in [facility] Medicine at [facility] Office Visit from 03/27/2022 in [facility] Primary Care & Sports Medicine at [facility]  Total GAD-7 Score 0 0 0      PHQ2-9    Flowsheet Row Office Visit from 02/01/2023 in [facility] Primary Care & Sports Medicine at [facility] Office Visit from 11/20/2022 in [facility] Office Visit from 11/08/2022 in [facility] Office Visit from 09/26/2022 in [facility] Primary Care & Sports Medicine at [facility] Office Visit from 03/27/2022 in [facility] Primary Care & Sports Medicine at [facility] Total Score 0 0 1 0 1  PHQ-9 Total Score 0 1 -- 0 1      Flowsheet Row Office Visit from  11/20/2022 in [facility] Office Visit from 11/08/2022 in [facility] ED from 09/08/2022 in [facility]  C-SSRS RISK CATEGORY No Risk No Risk No Risk        Assessment and Plan:  -Continue Invega sustenna 156 mg every 28 days -Follow-up for CBC, BMP, thyroid panel,  vitamin D for annual labs long acting medication basic metabolic panel  Collaboration of Care: Collaboration of Care: Medication Management AEB Invega sustenna 156   Patient/Guardian was advised Release of Information must be obtained prior to any record release in order to collaborate their care with an outside provider. Patient/Guardian was advised if they have not already done so to contact the registration department to sign all necessary forms in order for [place] to release information regarding their care.   Consent: Patient/Guardian gives verbal consent for treatment and assignment of benefits for services provided during this visit. Patient/Guardian expressed understanding and agreed to proceed.    Oneta Rack, NP 08/01/2023, 2:45 PM

## 2023-08-14 ENCOUNTER — Other Ambulatory Visit (HOSPITAL_COMMUNITY): Payer: Self-pay

## 2023-08-15 NOTE — Progress Notes (Unsigned)
 Brigitte Canard, PA-C [address] [place], [place]  [address] Phone: [phone]   Gastroenterology Consultation  Referring Provider:     de [place], Alonza Jansky, MD Primary Care Physician:  de [place], Alonza Jansky, MD Primary Gastroenterologist:  Brigitte Canard, PA-C / Darol Elizabeth, MD  Reason for Consultation:     Nausea, vomiting        HPI:   James [name] is a 33 y.o. y/o male referred for consultation & management  by de [place], Alonza Jansky, MD. He is referred to evaluate chronic nausea and vomiting.  No previous GI evaluation.  PMH: Significant for schizophrenia, paranoia.  Current symptoms: Patient states he has had nausea and vomiting for over 6 months.  Intermittent episodes of nausea and vomiting after eating, with strong smells, and after he drinks alcohol.  Used to drink heavily shots of vodka 5 days/week.  Last drink of vodka or alcohol was 2 weeks ago.  Currently having vomiting every other day now.  He last used marijuana 2 years ago.  No illicit drug use.  Occasionally takes ibuprofen .  He denies abdominal pain, heartburn, dysphagia, hematemesis, diarrhea, constipation, melena, hematochezia, or weight loss.  01/2023: [facility] 1 month history of nausea and vomiting.  Intermittent vomiting occurs every other day.  Not much nausea.  Mild central abdominal discomfort.  No change in bowel habits.  No hematemesis or change in weight.  Takes several psychotropic medications.  CBC, CMP, lipase labs unrevealing / normal..  04/2023 most recent labs: Normal Hemoglobin A1c 5.3, normal CBC (Hgb 16.2, WBC 4.9), normal CMP, normal TSH and free T4.  No abdominal imaging since 2019.  Past Medical History:  Diagnosis Date   Insomnia    Schizophrenia (HCC)    Subdural hematoma (HCC)     Past Surgical History:  Procedure Laterality Date   HAND SURGERY      Prior to Admission medications   Medication Sig Start Date End Date Taking?  Authorizing Provider  paliperidone  (INVEGA  SUSTENNA) 156 MG/ML SUSY injection Inject 1 mL (156 mg total) into the muscle every 28 (twenty-eight) days. 11/20/22   Arlyne Bering, NP  benztropine  (COGENTIN ) 0.5 MG tablet Take 1 tablet (0.5 mg total) by mouth 2 (two) times daily. 08/01/18 12/18/18  Suzi Essex, MD  fluticasone  (FLONASE ) 50 MCG/ACT nasal spray Place 1 spray into both nostrils daily. Patient not taking: Reported on 07/30/2018 12/21/17 12/18/18  Jerri Morale A, FNP  omega-3 acid ethyl esters (LOVAZA ) 1 g capsule Take 1 capsule (1 g total) by mouth 2 (two) times daily. 08/01/18 12/18/18  Suzi Essex, MD  risperiDONE  (RISPERDAL ) 3 MG tablet Take 2 tablets (6 mg total) by mouth at bedtime. 08/01/18 12/18/18  Suzi Essex, MD    Family History  Problem Relation Age of Onset   Healthy Mother    Heart disease Father      Social History   Tobacco Use   Smoking status: Former    Current packs/day: 0.50    Types: Cigarettes   Smokeless tobacco: Never  Vaping Use   Vaping status: Never Used  Substance Use Topics   Alcohol use: Yes    Comment: occasionally   Drug use: Not Currently    Types: Marijuana    Allergies as of 08/16/2023   (No Known Allergies)    Review of Systems:    All systems reviewed and negative except where noted in HPI.  Physical Exam:  BP 124/72   Pulse 92   Ht 5\' 8"  (1.727 m)   Wt 199 lb (90.3 kg)   BMI 30.26 kg/m  No LMP for male patient.  General:   Alert,  Well-developed, well-nourished, pleasant and cooperative in NAD Lungs:  Respirations even and unlabored.  Clear throughout to auscultation.   No wheezes, crackles, or rhonchi. No acute distress. Heart:  Regular rate and rhythm; no murmurs, clicks, rubs, or gallops. Abdomen:  Normal bowel sounds.  No bruits.  Soft, and non-distended without masses, hepatosplenomegaly or hernias noted.  No Tenderness.  No guarding or rebound tenderness.    Neurologic:  Alert and oriented x3;  grossly normal  neurologically. Psych:  Alert and cooperative. Flat mood and affect.  Imaging Studies: No results found.  Assessment and Plan:   SAVA PROBY is a 33 y.o. y/o male has been referred for:  1.  Chronic intermittent nausea and vomiting; normal labs.  Differential: Adverse side effect of psychotropic medication, cholelithiasis, gastritis, GERD, H. pylori, PUD, side effects of alcohol.  He Denies marijuana use or abdominal Pain.  - Abdominal ultrasound - Rule out Gallstones  - Trial of PPI - Pantoprazole  40mg  1 tablet once daily, #30, 2 RF  - He was advised to avoid all alcohol.  - EGD and H. Pylori Test if symptoms persist  2.  GERD  -Start Pantoprazole  40mg  1 tablet once daily, #30, 2 RF.  Follow up in 4 weeks to assess response to treatment.  Brigitte Canard, PA-C

## 2023-08-16 ENCOUNTER — Encounter: Payer: Self-pay | Admitting: Physician Assistant

## 2023-08-16 ENCOUNTER — Ambulatory Visit (INDEPENDENT_AMBULATORY_CARE_PROVIDER_SITE_OTHER): Admitting: Physician Assistant

## 2023-08-16 VITALS — BP 124/72 | HR 92 | Ht 68.0 in | Wt 199.0 lb

## 2023-08-16 DIAGNOSIS — K219 Gastro-esophageal reflux disease without esophagitis: Secondary | ICD-10-CM

## 2023-08-16 DIAGNOSIS — R112 Nausea with vomiting, unspecified: Secondary | ICD-10-CM

## 2023-08-16 MED ORDER — PANTOPRAZOLE SODIUM 40 MG PO TBEC
40.0000 mg | DELAYED_RELEASE_TABLET | Freq: Every day | ORAL | 2 refills | Status: DC
Start: 2023-08-16 — End: 2023-09-21

## 2023-08-16 NOTE — Patient Instructions (Signed)
 You have been scheduled for an abdominal ultrasound at [facility] (1st floor of hospital) on 08/22/2023 at 9:30 am. Please arrive 30 minutes prior to your appointment for registration. Make certain not to have anything to eat or drink 6 hours prior to your appointment. Should you need to reschedule your appointment, please contact radiology at [phone]. This test typically takes about 30 minutes to perform.  We have sent the following medications to your pharmacy for you to pick up at your convenience: Pantoprazole  40 mg daily   Due to recent changes in healthcare laws, you may see the results of your imaging and laboratory studies on MyChart before your provider has had a chance to review them.  We understand that in some cases there may be results that are confusing or concerning to you. Not all laboratory results come back in the same time frame and the provider may be waiting for multiple results in order to interpret others.  Please give us  48 hours in order for your provider to thoroughly review all the results before contacting the office for clarification of your results.   I appreciate the  opportunity to care for you  Thank You   Tina Garrett,PA-C

## 2023-08-17 ENCOUNTER — Other Ambulatory Visit: Payer: Self-pay | Admitting: Pharmacy Technician

## 2023-08-17 ENCOUNTER — Other Ambulatory Visit: Payer: Self-pay

## 2023-08-17 NOTE — Progress Notes (Signed)
 Specialty Pharmacy Refill Coordination Note  James [name] is a 33 y.o. male contacted today regarding refills of specialty medication(s) Paliperidone  Palmitate (INVEGA  SUSTENNA)   Patient requested Courier to Provider Office   Delivery date: 08/23/23   Verified address: [facility]  [address] [place], [place] [address]   Medication will be filled on 08/23/23.

## 2023-08-22 ENCOUNTER — Ambulatory Visit (HOSPITAL_COMMUNITY)
Admission: RE | Admit: 2023-08-22 | Discharge: 2023-08-22 | Disposition: A | Discharge status: Home / Self Care | Source: Ambulatory Visit | Attending: Physician Assistant | Admitting: Physician Assistant

## 2023-08-22 DIAGNOSIS — R112 Nausea with vomiting, unspecified: Secondary | ICD-10-CM

## 2023-08-23 ENCOUNTER — Other Ambulatory Visit (HOSPITAL_COMMUNITY): Payer: Self-pay

## 2023-08-23 ENCOUNTER — Other Ambulatory Visit: Payer: Self-pay

## 2023-08-29 ENCOUNTER — Ambulatory Visit (HOSPITAL_COMMUNITY)

## 2023-08-29 ENCOUNTER — Other Ambulatory Visit: Payer: Self-pay

## 2023-08-29 ENCOUNTER — Other Ambulatory Visit (HOSPITAL_COMMUNITY): Payer: Self-pay

## 2023-08-30 NOTE — Progress Notes (Signed)
 Agree with the assessment and plan as outlined by Brigitte Canard, PA-C.

## 2023-09-05 ENCOUNTER — Other Ambulatory Visit (HOSPITAL_COMMUNITY): Payer: Self-pay | Admitting: Family

## 2023-09-05 ENCOUNTER — Ambulatory Visit (HOSPITAL_COMMUNITY)

## 2023-09-05 ENCOUNTER — Other Ambulatory Visit (HOSPITAL_COMMUNITY): Payer: Self-pay

## 2023-09-05 DIAGNOSIS — F203 Undifferentiated schizophrenia: Secondary | ICD-10-CM

## 2023-09-05 MED ORDER — PALIPERIDONE PALMITATE ER 156 MG/ML IM SUSY: 156.0000 mg | PREFILLED_SYRINGE | INTRAMUSCULAR | 11 refills | Status: DC

## 2023-09-05 NOTE — Progress Notes (Signed)
 Invega  sustenna 156 mg was refilled and order sent to [facility]

## 2023-09-06 ENCOUNTER — Ambulatory Visit (INDEPENDENT_AMBULATORY_CARE_PROVIDER_SITE_OTHER)

## 2023-09-06 ENCOUNTER — Other Ambulatory Visit: Payer: Self-pay

## 2023-09-06 VITALS — BP 134/73 | HR 90 | Temp 98.4°F | Ht 68.0 in | Wt 198.0 lb

## 2023-09-06 DIAGNOSIS — F2081 Schizophreniform disorder: Secondary | ICD-10-CM

## 2023-09-06 NOTE — Progress Notes (Cosign Needed)
 Pt presents today for injection of Invega  sustenna given in left deltoid with no complaints. Pt states that he has been in good conditions since last visit, with no pains bothering him. Pt has a new job with new insurance so, gave patient a "patient assistance form" so that he could complete  his part, while encouraging him to do this ASAP. As this part contained sensitive information related to Pt.   JNL, CMA

## 2023-09-18 ENCOUNTER — Other Ambulatory Visit: Payer: Self-pay | Admitting: Pharmacy Technician

## 2023-09-18 ENCOUNTER — Other Ambulatory Visit: Payer: Self-pay

## 2023-09-18 NOTE — Progress Notes (Signed)
 Disenrolled; Notes in patient chart Invega  sustenna 156 mg was refilled and order sent to [facility]. Rx has been Discontinued here at [facility]

## 2023-09-20 NOTE — Progress Notes (Unsigned)
 James Canard, PA-C [address] [place], [place]  [address] Phone: [phone]   Primary Care Physician: de [place], Alonza Jansky, MD  Primary Gastroenterologist:  James Canard, PA-C / Darol Elizabeth, MD   Chief Complaint: Follow-up nausea, vomiting, GERD       HPI:   James [name] is a 33 y.o. male returns for 1 month follow-up of nausea, vomiting, and GERD.  He was previously drinking a lot of alcohol (shots of vodka 5 days/week) and also using marijuana.  He was started on pantoprazole  40 Mg once daily for the past month.  Was also advised to stop all alcohol and marijuana use.  He has been abstaining from alcohol and marijuana use?  08/22/23 RUQ abdominal ultrasound: Normal.  No gallstones.  CBD 3.7 mm.  Normal liver.  04/2023 most recent labs: Normal Hemoglobin A1c 5.3, normal CBC (Hgb 16.2, WBC 4.9), normal CMP, normal TSH and free T4.   PMH: Substance induced psychotic disorder, traumatic brain injury, paranoid schizophrenia.    Current Outpatient Medications  Medication Sig Dispense Refill   paliperidone  (INVEGA  SUSTENNA) 156 MG/ML SUSY injection Inject 1 mL (156 mg total) into the muscle every 28 (twenty-eight) days. 1 mL 11   pantoprazole  (PROTONIX ) 40 MG tablet Take 1 tablet (40 mg total) by mouth daily. 30 tablet 2   Current Facility-Administered Medications  Medication Dose Route Frequency Provider Last Rate Last Admin   paliperidone  (INVEGA  SUSTENNA) injection 156 mg  156 mg Intramuscular Q30 days Ardena Koyanagi E, NP   156 mg at 09/06/23 1644    Allergies as of 09/21/2023   (No Known Allergies)    Past Medical History:  Diagnosis Date   Insomnia    Schizophrenia (HCC)    Subdural hematoma (HCC)     Past Surgical History:  Procedure Laterality Date   HAND SURGERY      Review of Systems:    All systems reviewed and negative except where noted in HPI.    Physical Exam:  There were no vitals taken for this visit. No LMP for male  patient.  General: Well-nourished, well-developed in no acute distress.  Lungs: Clear to auscultation bilaterally. Non-labored. Heart: Regular rate and rhythm, no murmurs rubs or gallops.  Abdomen: Bowel sounds are normal; Abdomen is Soft; No hepatosplenomegaly, masses or hernias;  No Abdominal Tenderness; No guarding or rebound tenderness. Neuro: Alert and oriented x 3.  Grossly intact.  Psych: Alert and cooperative, normal mood and affect.   Imaging Studies: US  Abdomen Limited RUQ (LIVER/GB) Result Date: 08/22/2023 CLINICAL DATA:  Nausea and vomiting, assess for gallstones. EXAM: ULTRASOUND ABDOMEN LIMITED RIGHT UPPER QUADRANT COMPARISON:  CT abdomen pelvis Sep 16, 2017 FINDINGS: Gallbladder: No gallstones or wall thickening visualized. No sonographic Murphy sign noted by sonographer. Common bile duct: Diameter: 3.7 mm. Liver: No focal lesion identified. Within normal limits in parenchymal echogenicity. Portal vein is patent on color Doppler imaging with normal direction of blood flow towards the liver. Other: None. IMPRESSION: Normal right upper quadrant ultrasound. Electronically Signed   By: Anna Barnes M.D.   On: 08/22/2023 10:10    Labs: CBC    Component Value Date/Time   WBC 4.9 05/02/2023 0000   WBC 6.1 09/08/2022 1357   RBC 5.87 (H) 05/02/2023 0000   RBC 5.38 09/08/2022 1357   HGB 16.2 05/02/2023 0000   HCT 49.6 05/02/2023 0000   PLT 345 05/02/2023 0000   MCV 85 05/02/2023 0000   MCH 27.6  05/02/2023 0000   MCH 27.5 09/08/2022 1357   MCHC 32.7 05/02/2023 0000   MCHC 32.6 09/08/2022 1357   RDW 13.2 05/02/2023 0000   LYMPHSABS 1.2 05/02/2023 0000   MONOABS 0.2 12/21/2021 1237   EOSABS 0.1 05/02/2023 0000   BASOSABS 0.0 05/02/2023 0000    CMP     Component Value Date/Time   NA 141 05/02/2023 0000   K 4.3 05/02/2023 0000   CL 106 05/02/2023 0000   CO2 19 (L) 05/02/2023 0000   GLUCOSE 82 05/02/2023 0000   GLUCOSE 94 09/08/2022 1357   BUN 11 05/02/2023 0000    CREATININE 1.13 05/02/2023 0000   CALCIUM 9.3 05/02/2023 0000   PROT 6.1 05/02/2023 0000   ALBUMIN 4.0 (L) 05/02/2023 0000   AST 16 05/02/2023 0000   ALT 12 05/02/2023 0000   ALKPHOS 52 05/02/2023 0000   BILITOT 0.3 05/02/2023 0000   GFRNONAA >60 09/08/2022 1357   GFRAA >60 07/31/2018 0655       Assessment and Plan:   James [name] is a 33 y.o. y/o male returns for 1 month follow-up of:  1.  Nausea and vomiting; Recent labs and RUQ abdominal ultrasound were normal.  No gallstones.  Normal LFTs.  He has been on pantoprazole  40 Mg once daily for the past month with great benefit. - If persistent symptoms, then schedule EGD and check H. pylori  2.  GERD - Continue pantoprazole  40 Mg daily - Recommend Lifestyle Modifications to prevent Acid Reflux.  Rec. Avoid coffee, sodas, peppermint, garlic, onions, alcohol, citrus fruits, chocolate, tomatoes, fatty and spicey foods.  Avoid eating 2-3 hours before bedtime.    3.  Alcohol and marijuana use - Encouraged abstinence from alcohol and marijuana use - Discussed risk of hyperemesis cannabis syndrome    James Canard, PA-C  Follow up ***

## 2023-09-21 ENCOUNTER — Ambulatory Visit: Admitting: Physician Assistant

## 2023-09-21 ENCOUNTER — Encounter: Payer: Self-pay | Admitting: Physician Assistant

## 2023-09-21 VITALS — BP 102/64 | HR 93 | Ht 68.0 in | Wt 201.4 lb

## 2023-09-21 DIAGNOSIS — F1091 Alcohol use, unspecified, in remission: Secondary | ICD-10-CM

## 2023-09-21 DIAGNOSIS — K219 Gastro-esophageal reflux disease without esophagitis: Secondary | ICD-10-CM | POA: Diagnosis not present

## 2023-09-21 DIAGNOSIS — F1291 Cannabis use, unspecified, in remission: Secondary | ICD-10-CM | POA: Diagnosis not present

## 2023-09-21 MED ORDER — PANTOPRAZOLE SODIUM 40 MG PO TBEC: 40.0000 mg | DELAYED_RELEASE_TABLET | Freq: Every day | ORAL | 3 refills | Status: AC

## 2023-09-21 NOTE — Patient Instructions (Signed)
 We have sent the following medications to your pharmacy for you to pick up at your convenience: Pantoprazole    Follow-up 1 year, sooner if needed.   Due to recent changes in healthcare laws, you may see the results of your imaging and laboratory studies on MyChart before your provider has had a chance to review them.  We understand that in some cases there may be results that are confusing or concerning to you. Not all laboratory results come back in the same time frame and the provider may be waiting for multiple results in order to interpret others.  Please give us  48 hours in order for your provider to thoroughly review all the results before contacting the office for clarification of your results.   _______________________________________________________  If your blood pressure at your visit was 140/90 or greater, please contact your primary care physician to follow up on this.  _______________________________________________________  If you are age 34 or older, your body mass index should be between 23-30. Your Body mass index is 30.62 kg/m. If this is out of the aforementioned range listed, please consider follow up with your Primary Care Provider.  If you are age 34 or younger, your body mass index should be between 19-25. Your Body mass index is 30.62 kg/m. If this is out of the aformentioned range listed, please consider follow up with your Primary Care Provider.   ________________________________________________________  The [facility] providers would like to encourage you to use MYCHART to communicate with providers for non-urgent requests or questions.  Due to long hold times on the telephone, sending your provider a message by [facility] may be a faster and more efficient way to get a response.  Please allow 48 business hours for a response.  Please remember that this is for non-urgent requests.  _______________________________________________________  Thank you for choosing me and   Gastroenterology.  Brigitte Canard, PA-C

## 2023-10-04 ENCOUNTER — Ambulatory Visit (HOSPITAL_COMMUNITY)

## 2023-10-08 NOTE — Progress Notes (Signed)
 Agree with the assessment and plan as outlined by Brigitte Canard, PA-C.

## 2023-10-16 ENCOUNTER — Telehealth (HOSPITAL_COMMUNITY): Payer: Self-pay

## 2023-10-16 NOTE — Telephone Encounter (Signed)
 Error encounter.   JNL

## 2023-10-24 ENCOUNTER — Ambulatory Visit (HOSPITAL_COMMUNITY): Payer: MEDICAID

## 2023-11-07 ENCOUNTER — Telehealth (HOSPITAL_COMMUNITY): Payer: Self-pay

## 2023-11-07 NOTE — Telephone Encounter (Signed)
 Called patient to get him an appointment for injection, no answer left voicemail & sent message via MyChart for patient to call us  back to be scheduled ASAP. Thanks!

## 2023-11-28 ENCOUNTER — Ambulatory Visit (HOSPITAL_COMMUNITY): Payer: MEDICAID

## 2023-12-05 ENCOUNTER — Encounter (HOSPITAL_COMMUNITY): Payer: Self-pay

## 2024-01-15 ENCOUNTER — Encounter (HOSPITAL_COMMUNITY): Payer: Self-pay

## 2024-01-15 ENCOUNTER — Ambulatory Visit (INDEPENDENT_AMBULATORY_CARE_PROVIDER_SITE_OTHER): Payer: MEDICAID | Admitting: Psychiatry

## 2024-01-15 ENCOUNTER — Ambulatory Visit (HOSPITAL_COMMUNITY): Payer: MEDICAID

## 2024-01-15 VITALS — BP 145/95 | HR 75 | Temp 98.5°F | Wt 199.4 lb

## 2024-01-15 DIAGNOSIS — F200 Paranoid schizophrenia: Secondary | ICD-10-CM | POA: Diagnosis not present

## 2024-01-15 DIAGNOSIS — F203 Undifferentiated schizophrenia: Secondary | ICD-10-CM

## 2024-01-15 MED ORDER — PALIPERIDONE ER 6 MG PO TB24: 6.0000 mg | ORAL_TABLET | Freq: Every day | ORAL | 0 refills | Status: AC

## 2024-01-15 MED ORDER — PALIPERIDONE PALMITATE ER 156 MG/ML IM SUSY: 156.0000 mg | PREFILLED_SYRINGE | INTRAMUSCULAR | 11 refills | Status: AC
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
## 2024-01-17 ENCOUNTER — Telehealth (HOSPITAL_COMMUNITY): Payer: Self-pay | Admitting: Psychiatry

## 2024-01-17 NOTE — Telephone Encounter (Signed)
 Patient's mother, Jon Balloon, left a voice mail message at 5:27 pm on 01/16/24 to say that her son, James [name], was seen three days ago and that his medication is still not at the pharmacy. Please advise. Thank you.

## 2024-01-21 ENCOUNTER — Emergency Department (HOSPITAL_COMMUNITY): Payer: MEDICAID

## 2024-01-21 ENCOUNTER — Emergency Department (HOSPITAL_COMMUNITY)
Admission: EM | Admit: 2024-01-21 | Discharge: 2024-01-21 | Disposition: A | Discharge status: Home / Self Care | Payer: MEDICAID | Attending: Emergency Medicine | Admitting: Emergency Medicine

## 2024-01-21 ENCOUNTER — Encounter (HOSPITAL_COMMUNITY): Payer: Self-pay

## 2024-01-21 ENCOUNTER — Other Ambulatory Visit: Payer: Self-pay

## 2024-01-21 DIAGNOSIS — R042 Hemoptysis: Secondary | ICD-10-CM | POA: Insufficient documentation

## 2024-01-21 DIAGNOSIS — R051 Acute cough: Secondary | ICD-10-CM | POA: Diagnosis present

## 2024-01-21 LAB — D-DIMER, QUANTITATIVE: D-Dimer, Quant: <0.27 ug{FEU}/mL (ref 0.00–0.50)

## 2024-01-21 LAB — RPR: RPR Ser Ql: NONREACTIVE

## 2024-01-21 LAB — HIV ANTIBODY (ROUTINE TESTING W REFLEX): HIV Screen 4th Generation wRfx: NONREACTIVE

## 2024-01-21 MED ORDER — ACETAMINOPHEN 500 MG PO TABS
1000.0000 mg | ORAL_TABLET | Freq: Once | ORAL | Status: AC
  Administered 2024-01-21: 1000 mg via ORAL
  Filled 2024-01-21: qty 2

## 2024-01-21 NOTE — ED Provider Notes (Signed)
 [name] EMERGENCY DEPARTMENT AT [facility] Provider Note   CSN: [id number] Arrival date & time: 01/21/24  1028     Patient presents with: Insect Bite   James [name] is a 33 y.o. male.  33 year old male presents to the ED with complaints of hemoptysis x 2 days secondary to ant bite on 22 September.  Patient has significant history of schizophrenia treated with Invega .  Patient advises last week he got bit by a black ant and was seen by his primary care.  She reported if the pain got worse to go to the ER.  He advises that he feels like there is a pain in his neck and he can feel it going up into his throat.  He also reported 2 days ago he started having a cough with mucus and reported he had spit up blood 2 times.  Patient reported small amount of blood.  Patient denies chest pain, shortness of breath, dizziness, syncope, fevers, or arthralgias/myalgias.    Prior to Admission medications   Medication Sig Start Date End Date Taking? Authorizing Provider  paliperidone  (INVEGA  SUSTENNA) 156 MG/ML SUSY injection Inject 1 mL (156 mg total) into the muscle every 28 (twenty-eight) days. 01/15/24   Harl Zane FORBES, NP  paliperidone  (INVEGA ) 6 MG 24 hr tablet Take 1 tablet (6 mg total) by mouth daily. 01/15/24   Harl Zane FORBES, NP  pantoprazole  (PROTONIX ) 40 MG tablet Take 1 tablet (40 mg total) by mouth daily. 09/21/23 09/15/24  [place], PA-C  benztropine  (COGENTIN ) 0.5 MG tablet Take 1 tablet (0.5 mg total) by mouth 2 (two) times daily. 08/01/18 12/18/18  Malinda Rogue, MD  fluticasone  (FLONASE ) 50 MCG/ACT nasal spray Place 1 spray into both nostrils daily. Patient not taking: Reported on 07/30/2018 12/21/17 12/18/18  Adah Corning A, FNP  omega-3 acid ethyl esters (LOVAZA ) 1 g capsule Take 1 capsule (1 g total) by mouth 2 (two) times daily. 08/01/18 12/18/18  Malinda Rogue, MD  risperiDONE  (RISPERDAL ) 3 MG tablet Take 2 tablets (6 mg total) by mouth at bedtime. 08/01/18 12/18/18  Malinda Rogue, MD    Allergies: Patient has no known allergies.    Review of Systems  Constitutional:  Positive for fatigue.  Respiratory:  Positive for cough.   All other systems reviewed and are negative.   Updated Vital Signs BP 138/88   Pulse 90   Temp 98 F (36.7 C) (Oral)   Resp 16   Ht 5' 8 (1.727 m)   Wt 90.7 kg   SpO2 99%   BMI 30.41 kg/m   Physical Exam Vitals and nursing note reviewed.  Constitutional:      Appearance: Normal appearance.  HENT:     Head: Normocephalic and atraumatic.     Nose: Nose normal.  Eyes:     Extraocular Movements: Extraocular movements intact.     Conjunctiva/sclera: Conjunctivae normal.     Pupils: Pupils are equal, round, and reactive to light.  Cardiovascular:     Rate and Rhythm: Normal rate.  Pulmonary:     Effort: Pulmonary effort is normal. No respiratory distress.  Musculoskeletal:        General: Normal range of motion.     Cervical back: Normal range of motion.  Neurological:     General: No focal deficit present.     Mental Status: He is alert.  Psychiatric:        Mood and Affect: Mood normal.        Behavior:  Behavior normal.     (all labs ordered are listed, but only abnormal results are displayed) Labs Reviewed  D-DIMER, QUANTITATIVE  HIV ANTIBODY (ROUTINE TESTING W REFLEX)  RPR  GC/CHLAMYDIA PROBE AMP () NOT AT [facility]    EKG: None  Radiology: DG Chest Portable 1 View Result Date: 01/21/2024 CLINICAL DATA:  Hemoptysis. EXAM: PORTABLE CHEST 1 VIEW COMPARISON:  09/16/2017 FINDINGS: The heart size and mediastinal contours are within normal limits. Both lungs are clear. The visualized skeletal structures are unremarkable. IMPRESSION: No active disease. Electronically Signed   By: Norleen DELENA Kil M.D.   On: 01/21/2024 11:57     Procedures   Medications Ordered in the ED  acetaminophen  (TYLENOL ) tablet 1,000 mg (1,000 mg Oral Given 01/21/24 1150)    33 y.o. male presents to the ED with complaints of  insect bite with associated cough and hemoptysis x 2-day., this involves an extensive number of treatment options, and is a complaint that carries with it a high risk of complications and morbidity.  The differential diagnosis includes viral illness, lung carcinoma, TB, cellulitis, allergic reaction, pneumonia, PE (Ddx)  On arrival pt is nontoxic, vitals hypertensive and tachycardic in triage.  Exam is unremarkable  Additional history obtained from chart review significant for schizophrenia managed by behavioral health  I ordered medication Tylenol  for headache  Lab Tests:  I Ordered, reviewed, and interpreted labs, which included: HIV syphilis GC chlamydia  Imaging Studies ordered:  I ordered imaging studies which included chest x-ray, I independently visualized and interpreted imaging which showed no acute abnormalities  ED Course:   33 year old male presenting to the ED with complaints of insect bite with associated hemoptysis and cough x 3 days.  Patient sitting comfortably in ED bed in no obvious distress and on toxic appearing.  Patient has no obvious signs of swelling or bite around the neck.  No warmth or erythema around the neck.  No signs of cellulitis on the skin with no rashes or pain to palpation of the neck.  No lymphadenopathy.  Tonsils are not erythematous with no exudates or swelling.  Lungs are clear to auscultation in all fields and patient has no increased respiratory rate or work of breathing.  Patient is not actively coughing.  Patient denies any change of medications.  Initial plan is to get a chest x-ray to evaluate for any lung nodules due to hemoptysis.  Nurse advised he wanted to get tested for STIs.  It was discussed with patient and we will test for syphilis, G gonorrhea, chlamydia, HIV.  Patient also requested medication for his headache.  Patient denied headache on initial encounter. Pt reports headache as mild without associated blurry vision, vision, loss, or  dizziness.   On reassessment patient is still tachycardic. Patient is in no distress and denies SHOB.  Patient has not been on any recent travels.  No signs of DVT.  Wells score 2.5.  After discussion with attending it was advised to get D-dimer to rule out PE.  D-dimer was unremarkable.  Upon reevaluation patient was sitting comfortably in ED bed.  Patient reported he was feeling better and it was advised to follow-up with primary care if he is not feeling better in the next couple days.  Patient was advised to return for concerning symptoms and agreed with treatment plan and was comfortable with discharge.  Portions of this note were generated with Scientist, clinical (histocompatibility and immunogenetics). Dictation errors may occur despite best attempts at proofreading.   Final diagnoses:  Acute cough    ED Discharge Orders     None

## 2024-01-21 NOTE — Discharge Instructions (Signed)
 Lab work exam and x-ray were reassuring today.  Continue to monitor for worsening symptoms.  Follow-up with your primary care this week for further evaluation of illness.  If worsening or concerning symptoms occur return to ED for further evaluation.

## 2024-01-21 NOTE — ED Notes (Signed)
 Patient states unable to urinate at this time. Provided with cranberry juice.

## 2024-01-21 NOTE — ED Notes (Signed)
Patient states unable to urinate at this time.

## 2024-01-21 NOTE — ED Triage Notes (Signed)
 Pt came invia POV d/t last week he was at work & felt a sharp pinch on his neck & when he touched it there was a black ant. 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 States his neck began to feel tight from swelling 2-3 days ago, denied fevers, endorsees HA. No swelling or distress noted during triage. A/Ox4, rates his pain 8/10 during triage.

## 2024-01-21 NOTE — ED Notes (Signed)
 Patient continues to be unwilling to attempt to provide urine sample.

## 2024-01-22 ENCOUNTER — Ambulatory Visit (INDEPENDENT_AMBULATORY_CARE_PROVIDER_SITE_OTHER): Payer: MEDICAID

## 2024-01-22 ENCOUNTER — Encounter (HOSPITAL_COMMUNITY): Payer: Self-pay

## 2024-01-22 VITALS — BP 136/83 | HR 88 | Wt 199.8 lb

## 2024-01-22 DIAGNOSIS — F200 Paranoid schizophrenia: Secondary | ICD-10-CM

## 2024-01-22 MED ORDER — PALIPERIDONE PALMITATE ER 156 MG/ML IM SUSY
156.0000 mg | PREFILLED_SYRINGE | Freq: Once | INTRAMUSCULAR | Status: AC
  Administered 2024-01-22: 156 mg via INTRAMUSCULAR

## 2024-01-22 NOTE — Progress Notes (Signed)
  PATIENT PRESENTS TO THE OFFICE FOR INVEGA SUSTENNA  INJECTION  156, WAS GIVEN BY James [name] IN THE LEFT DELTOID WITH NO COMPLAINTS PT TOLERATED WELL. AND WILL RETURN 28 DAYS

## 2024-02-12 ENCOUNTER — Ambulatory Visit (HOSPITAL_COMMUNITY): Payer: MEDICAID

## 2024-02-19 ENCOUNTER — Encounter (HOSPITAL_COMMUNITY): Payer: Self-pay

## 2024-02-19 ENCOUNTER — Ambulatory Visit (INDEPENDENT_AMBULATORY_CARE_PROVIDER_SITE_OTHER): Payer: MEDICAID

## 2024-02-19 VITALS — BP 132/70 | HR 96 | Ht 68.0 in | Wt 201.0 lb

## 2024-02-19 DIAGNOSIS — F200 Paranoid schizophrenia: Secondary | ICD-10-CM | POA: Diagnosis not present

## 2024-02-19 DIAGNOSIS — F2081 Schizophreniform disorder: Secondary | ICD-10-CM

## 2024-02-19 MED ORDER — PALIPERIDONE PALMITATE ER 156 MG/ML IM SUSY
156.0000 mg | PREFILLED_SYRINGE | INTRAMUSCULAR | Status: AC
  Administered 2024-02-19 – 2024-04-02 (×2): 156 mg via INTRAMUSCULAR

## 2024-02-19 NOTE — Progress Notes (Cosign Needed)
 Patient presented to office for injection of Invega  Sustenna 156 mg. Patient received injection in right deltoid. Patient tolerated injection well. Patient denies SI/HI/AVH. Patient presented paranoid, requesting to see box that medication came in.Patient reports he is currently without insurance. Patient was given a sample today. Patient is unsure when he will have insurance. offered to speak with social worker, patient reports not having time.he tells people he is not schizophrenic. Patient denies any concerns. Patient will return in 28 days.

## 2024-03-14 ENCOUNTER — Other Ambulatory Visit (HOSPITAL_COMMUNITY): Payer: Self-pay

## 2024-03-14 ENCOUNTER — Other Ambulatory Visit: Payer: Self-pay

## 2024-03-18 ENCOUNTER — Ambulatory Visit (HOSPITAL_COMMUNITY): Payer: MEDICAID

## 2024-03-26 ENCOUNTER — Ambulatory Visit (HOSPITAL_COMMUNITY): Payer: MEDICAID

## 2024-04-02 ENCOUNTER — Ambulatory Visit (INDEPENDENT_AMBULATORY_CARE_PROVIDER_SITE_OTHER): Payer: MEDICAID | Admitting: Family

## 2024-04-02 ENCOUNTER — Ambulatory Visit (INDEPENDENT_AMBULATORY_CARE_PROVIDER_SITE_OTHER): Payer: MEDICAID

## 2024-04-02 VITALS — BP 139/80 | HR 91 | Ht 68.0 in | Wt 212.0 lb

## 2024-04-02 DIAGNOSIS — F200 Paranoid schizophrenia: Secondary | ICD-10-CM

## 2024-04-02 DIAGNOSIS — Z79899 Other long term (current) drug therapy: Secondary | ICD-10-CM | POA: Diagnosis not present

## 2024-04-02 NOTE — Progress Notes (Unsigned)
 [facility] MD/PA/NP OP Progress Note  04/04/2024 12:09 PM James [name]  MRN:  [id number]  Chief Complaint: De stated I need STD testing.   Long acting Injection Clinic  HPI: James [name] 33 year old African-American male presents for long-acting injections.  Currently, prescribed Invega  156 every 28 days.  Reports he has been taking and tolerating medications well.  He is denying any suicidal or homicidal ideations.  Denies auditory or visual hallucinations.  Does have concerns related to STI testing.  Discussed following up with the local urgent care and/or primary care.  Denied any notable symptoms only to say having lower back pain.   RN provided additional outpatient resources for for primary care.  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  Reports he is hopeful to return back next year and work full-time.  States he is also considering relocating to [place] or [place] within this year.  Will continue to monitor symptoms.  Aims 0 denies akathisia, restlessness or mood irritability.  No concerns related to sleep disturbance or appetite issues.  Support encouragement reassurance was provided.  [name] is ;he is alert/oriented x 3; calm/cooperative; and mood congruent with affect.  Patient is speaking in a clear tone at moderate volume, and normal pace; with good eye contact. His  thought process is coherent and relevant; There is no indication that he is currently responding to internal/external stimuli or experiencing delusional thought content.  Present disheveled but pleasant patient denies suicidal/self-harm/homicidal ideation, psychosis, and paranoia.  Patient has remained calm throughout assessment and has answered questions appropriately.   Visit Diagnosis:    ICD-10-CM   1. 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 Long term current use of antipsychotic medication  Z79.899       Past Psychiatric History:   Past Medical History:  Past Medical History:   Diagnosis Date   GERD (gastroesophageal reflux disease)    Insomnia    Schizophrenia (HCC)    Subdural hematoma (HCC)     Past Surgical History:  Procedure Laterality Date   HAND SURGERY      Family Psychiatric History:   Family History:  Family History  Problem Relation Age of Onset   Healthy Mother    Heart disease Father     Social History:  Social History   Socioeconomic History   Marital status: Single    Spouse name: Not on file   Number of children: 2   Years of education: Not on file   Highest education level: Not on file  Occupational History   Not on file  Tobacco Use   Smoking status: Former    Current packs/day: 0.50    Types: Cigarettes   Smokeless tobacco: Never  Vaping Use   Vaping status: Never Used  Substance and Sexual Activity   Alcohol use: Yes    Comment: occasionally   Drug use: Not Currently    Types: Marijuana   Sexual activity: Yes    Birth control/protection: Condom    Comment: occasional condom use  Other Topics Concern   Not on file  Social History Narrative   Not on file   Social Drivers of Health   Tobacco Use: Medium Risk (02/19/2024)   Patient History    Smoking Tobacco Use: Former    Smokeless Tobacco Use: Never    Passive Exposure: Not on Actuary Strain: Not on file  Food Insecurity: No Food Insecurity (09/08/2022)   Hunger Vital Sign    Worried About  Running Out of Food in the Last Year: Never true    Ran Out of Food in the Last Year: Never true  Transportation Needs: No Transportation Needs (09/08/2022)   PRAPARE - Administrator, Civil Service (Medical): No    Lack of Transportation (Non-Medical): No  Physical Activity: Not on file  Stress: Not on file  Social Connections: Not on file  Depression (PHQ2-9): Low Risk (01/15/2024)   Depression (PHQ2-9)    PHQ-2 Score: 1  Alcohol Screen: Not on file  Housing: Low Risk (09/08/2022)   Housing    Last Housing Risk Score: 0  Utilities:  Not At Risk (09/08/2022)   AHC Utilities    Threatened with loss of utilities: No  Health Literacy: Not on file    Allergies: No Known Allergies  Metabolic Disorder Labs: Lab Results  Component Value Date   HGBA1C 5.3 05/02/2023   MPG 105.41 07/31/2018   Lab Results  Component Value Date   PROLACTIN 18.1 (H) 12/21/2021   Lab Results  Component Value Date   CHOL 254 (H) 12/21/2021   TRIG 91 12/21/2021   HDL 36 (L) 12/21/2021   CHOLHDL 7.1 12/21/2021   VLDL 18 12/21/2021   LDLCALC 200 (H) 12/21/2021   LDLCALC 188 (H) 11/10/2021   Lab Results  Component Value Date   TSH 1.160 05/02/2023   TSH 1.335 12/21/2021    Therapeutic Level Labs: No results found for: LITHIUM No results found for: VALPROATE No results found for: CBMZ  Current Medications: Current Outpatient Medications  Medication Sig Dispense Refill   paliperidone  (INVEGA  SUSTENNA) 156 MG/ML SUSY injection Inject 1 mL (156 mg total) into the muscle every 28 (twenty-eight) days. 1 mL 11   paliperidone  (INVEGA ) 6 MG 24 hr tablet Take 1 tablet (6 mg total) by mouth daily. 10 tablet 0   pantoprazole  (PROTONIX ) 40 MG tablet Take 1 tablet (40 mg total) by mouth daily. 90 tablet 3   Current Facility-Administered Medications  Medication Dose Route Frequency Provider Last Rate Last Admin   paliperidone  (INVEGA  SUSTENNA) injection 156 mg  156 mg Intramuscular Q28 days Harl Regan E, NP   156 mg at 04/02/24 1431     Musculoskeletal: Strength & Muscle Tone: within normal limits Gait & Station: normal Patient leans: N/A  Psychiatric Specialty Exam: Review of Systems  There were no vitals taken for this visit.There is no height or weight on file to calculate BMI.  General Appearance: Casual  Eye Contact:  Good  Speech:  Clear and Coherent  Volume:  Normal  Mood:  Euphoric  Affect:  Congruent  Thought Process:  Coherent  Orientation:  Full (Time, Place, and Person)  Thought Content: Logical    Suicidal Thoughts:  No  Homicidal Thoughts:  No  Memory:  Immediate;   Good Recent;   Good  Judgement:  Good  Insight:  Good  Psychomotor Activity:  Normal  Concentration:  Concentration: Good  Recall:  Good  Fund of Knowledge: Good  Language: Good  Akathisia:  No  Handed:  Right  AIMS (if indicated): not done  Assets:  Communication Skills Desire for Improvement  ADL's:  Intact  Cognition: WNL  Sleep:  Good   Screenings: AIMS    Flowsheet Row Office Visit from 11/08/2022 in [facility] Admission (Discharged) [facility]  AIMS Total Score 0 0   AUDIT    Flowsheet Row Admission (Discharged) from  [facility]  Alcohol Use Disorder Identification Test Final Score (AUDIT) 1   GAD-7    Flowsheet Row Office Visit from 01/15/2024 in [facility] Office Visit from 11/20/2022 in [facility] Office Visit from 09/26/2022 in [facility] Primary Care & Sports Medicine at [facility] Office Visit from 03/27/2022 in [facility] Medicine at [facility]  Total GAD-7 Score 0 0 0 0   PHQ2-9    Flowsheet Row Office Visit from 01/15/2024 in [facility] Office Visit from 02/01/2023 in [facility] Primary Care & Sports Medicine at [facility] Office Visit from 11/20/2022 in [facility] Office Visit from 11/08/2022 in [facility] Office Visit from 09/26/2022 in [facility] Primary Care & Sports Medicine at [facility]  PHQ-2 Total Score 0 0 0 1 0  PHQ-9 Total Score 1 0 1 -- 0   Flowsheet Row ED from 01/21/2024 in [facility] Emergency Department at [facility] Office Visit from 11/20/2022 in [facility] Office Visit from 11/08/2022  in [facility]  C-SSRS RISK CATEGORY No Risk No Risk No Risk     Assessment and Plan:  Follow-up with Invega  sustain a 28 days - Encouraged to follow-up with primary care for STI testing Patient is due for annual thyroid , CMP, prolactin, CBC CMP and EKG due to long-term psychotropic use  Collaboration of Care: Collaboration of Care: Primary Care Provider AEB annual labs  Patient/Guardian was advised Release of Information must be obtained prior to any record release in order to collaborate their care with an outside provider. Patient/Guardian was advised if they have not already done so to contact the registration department to sign all necessary forms in order for us  to release information regarding their care.   Consent: Patient/Guardian gives verbal consent for treatment and assignment of benefits for services provided during this visit. Patient/Guardian expressed understanding and agreed to proceed.    Staci LOISE Kerns, NP 04/04/2024, 12:09 PM

## 2024-04-02 NOTE — Progress Notes (Signed)
 Pt came in into the office to receive injection of Invega  Sustenna 156mg . Prior to injection patient was seen by Staci Kerns, NP. Pt denies SI/HI/AVH. Pt received injection in left deltoid. Pt tolerated injection well and without complaint. pt left alert and ambulatory. pt will return in 28 days.

## 2024-04-11 ENCOUNTER — Other Ambulatory Visit: Payer: Self-pay

## 2024-04-21 ENCOUNTER — Other Ambulatory Visit: Payer: Self-pay

## 2024-05-01 ENCOUNTER — Ambulatory Visit (HOSPITAL_COMMUNITY): Payer: MEDICAID

## 2024-05-08 ENCOUNTER — Ambulatory Visit (HOSPITAL_COMMUNITY): Payer: MEDICAID

## 2024-05-21 ENCOUNTER — Other Ambulatory Visit (HOSPITAL_COMMUNITY): Payer: Self-pay
